# Patient Record
Sex: Female | Born: 1940 | Race: White | Hispanic: No | Marital: Married | Smoking: Never smoker
Health system: Southern US, Community
[De-identification: ages and names within clinical notes are randomized; demographics above are authoritative.]

## PROBLEM LIST (undated history)

## (undated) DIAGNOSIS — G35 Multiple sclerosis: Secondary | ICD-10-CM

## (undated) DIAGNOSIS — K219 Gastro-esophageal reflux disease without esophagitis: Secondary | ICD-10-CM

## (undated) DIAGNOSIS — F419 Anxiety disorder, unspecified: Secondary | ICD-10-CM

## (undated) DIAGNOSIS — M1990 Unspecified osteoarthritis, unspecified site: Secondary | ICD-10-CM

## (undated) DIAGNOSIS — J300 Vasomotor rhinitis: Secondary | ICD-10-CM

## (undated) HISTORY — DX: Anxiety disorder, unspecified: F41.9

## (undated) HISTORY — DX: Vasomotor rhinitis: J30.0

## (undated) HISTORY — DX: Unspecified osteoarthritis, unspecified site: M19.90

## (undated) HISTORY — DX: Multiple sclerosis: G35

## (undated) HISTORY — PX: ABDOMINAL HYSTERECTOMY: SHX81

## (undated) HISTORY — DX: Gastro-esophageal reflux disease without esophagitis: K21.9

---

## 1962-03-09 HISTORY — PX: BREAST LUMPECTOMY: SHX2

## 1997-07-19 ENCOUNTER — Ambulatory Visit (HOSPITAL_COMMUNITY): Admission: RE | Admit: 1997-07-19 | Discharge: 1997-07-19 | Payer: Self-pay | Admitting: Neurology

## 2003-04-17 ENCOUNTER — Emergency Department (HOSPITAL_COMMUNITY): Admission: EM | Admit: 2003-04-17 | Discharge: 2003-04-17 | Payer: Self-pay | Admitting: *Deleted

## 2003-10-05 ENCOUNTER — Encounter: Admission: RE | Admit: 2003-10-05 | Discharge: 2003-10-05 | Payer: Self-pay | Admitting: Neurology

## 2003-10-08 ENCOUNTER — Ambulatory Visit (HOSPITAL_COMMUNITY): Admission: RE | Admit: 2003-10-08 | Discharge: 2003-10-08 | Payer: Self-pay | Admitting: Neurology

## 2004-01-21 ENCOUNTER — Ambulatory Visit: Payer: Self-pay | Admitting: Family Medicine

## 2005-01-16 ENCOUNTER — Ambulatory Visit: Payer: Self-pay | Admitting: Internal Medicine

## 2005-12-25 ENCOUNTER — Ambulatory Visit: Payer: Self-pay | Admitting: Internal Medicine

## 2006-01-08 ENCOUNTER — Ambulatory Visit: Payer: Self-pay | Admitting: Internal Medicine

## 2006-04-06 ENCOUNTER — Ambulatory Visit: Payer: Self-pay | Admitting: Internal Medicine

## 2006-06-22 ENCOUNTER — Ambulatory Visit: Payer: Self-pay | Admitting: Internal Medicine

## 2006-09-02 ENCOUNTER — Ambulatory Visit: Payer: Self-pay | Admitting: Internal Medicine

## 2006-11-24 ENCOUNTER — Ambulatory Visit: Payer: Self-pay | Admitting: Internal Medicine

## 2007-01-07 ENCOUNTER — Encounter: Payer: Self-pay | Admitting: Internal Medicine

## 2007-01-14 ENCOUNTER — Ambulatory Visit: Payer: Self-pay | Admitting: Internal Medicine

## 2007-02-17 ENCOUNTER — Ambulatory Visit: Payer: Self-pay | Admitting: Internal Medicine

## 2007-05-03 ENCOUNTER — Ambulatory Visit: Payer: Self-pay | Admitting: Internal Medicine

## 2007-05-18 ENCOUNTER — Telehealth (INDEPENDENT_AMBULATORY_CARE_PROVIDER_SITE_OTHER): Payer: Self-pay | Admitting: *Deleted

## 2007-05-31 ENCOUNTER — Telehealth (INDEPENDENT_AMBULATORY_CARE_PROVIDER_SITE_OTHER): Payer: Self-pay | Admitting: *Deleted

## 2007-08-09 ENCOUNTER — Ambulatory Visit: Payer: Self-pay | Admitting: Internal Medicine

## 2007-08-10 ENCOUNTER — Encounter: Payer: Self-pay | Admitting: Internal Medicine

## 2007-08-18 DIAGNOSIS — M1990 Unspecified osteoarthritis, unspecified site: Secondary | ICD-10-CM | POA: Insufficient documentation

## 2007-08-18 DIAGNOSIS — J309 Allergic rhinitis, unspecified: Secondary | ICD-10-CM | POA: Insufficient documentation

## 2007-08-18 DIAGNOSIS — G8250 Quadriplegia, unspecified: Secondary | ICD-10-CM | POA: Insufficient documentation

## 2007-08-18 DIAGNOSIS — G35 Multiple sclerosis: Secondary | ICD-10-CM | POA: Insufficient documentation

## 2007-08-18 DIAGNOSIS — F411 Generalized anxiety disorder: Secondary | ICD-10-CM | POA: Insufficient documentation

## 2007-09-06 ENCOUNTER — Telehealth (INDEPENDENT_AMBULATORY_CARE_PROVIDER_SITE_OTHER): Payer: Self-pay | Admitting: *Deleted

## 2007-09-19 ENCOUNTER — Telehealth (INDEPENDENT_AMBULATORY_CARE_PROVIDER_SITE_OTHER): Payer: Self-pay | Admitting: *Deleted

## 2007-10-13 ENCOUNTER — Telehealth: Payer: Self-pay | Admitting: Internal Medicine

## 2007-10-18 ENCOUNTER — Telehealth: Payer: Self-pay | Admitting: Internal Medicine

## 2007-11-10 ENCOUNTER — Encounter: Payer: Self-pay | Admitting: Internal Medicine

## 2007-11-10 ENCOUNTER — Ambulatory Visit: Payer: Self-pay | Admitting: Internal Medicine

## 2008-02-07 ENCOUNTER — Encounter: Payer: Self-pay | Admitting: Internal Medicine

## 2008-02-07 ENCOUNTER — Ambulatory Visit: Payer: Self-pay | Admitting: Internal Medicine

## 2008-02-07 DIAGNOSIS — K219 Gastro-esophageal reflux disease without esophagitis: Secondary | ICD-10-CM

## 2008-03-12 ENCOUNTER — Telehealth: Payer: Self-pay | Admitting: Internal Medicine

## 2008-05-10 ENCOUNTER — Encounter: Payer: Self-pay | Admitting: Internal Medicine

## 2008-05-10 ENCOUNTER — Ambulatory Visit: Payer: Self-pay | Admitting: Internal Medicine

## 2008-05-10 DIAGNOSIS — J180 Bronchopneumonia, unspecified organism: Secondary | ICD-10-CM | POA: Insufficient documentation

## 2008-05-14 ENCOUNTER — Telehealth: Payer: Self-pay | Admitting: Internal Medicine

## 2008-08-14 ENCOUNTER — Encounter: Payer: Self-pay | Admitting: Internal Medicine

## 2008-08-14 ENCOUNTER — Ambulatory Visit: Payer: Self-pay | Admitting: Internal Medicine

## 2008-08-14 DIAGNOSIS — F068 Other specified mental disorders due to known physiological condition: Secondary | ICD-10-CM

## 2008-08-14 DIAGNOSIS — M79609 Pain in unspecified limb: Secondary | ICD-10-CM | POA: Insufficient documentation

## 2008-09-07 ENCOUNTER — Telehealth: Payer: Self-pay | Admitting: Family Medicine

## 2008-10-12 ENCOUNTER — Telehealth: Payer: Self-pay | Admitting: Internal Medicine

## 2008-11-08 ENCOUNTER — Telehealth: Payer: Self-pay | Admitting: Internal Medicine

## 2008-11-28 ENCOUNTER — Ambulatory Visit: Payer: Self-pay | Admitting: Internal Medicine

## 2008-11-28 ENCOUNTER — Encounter: Payer: Self-pay | Admitting: Internal Medicine

## 2009-02-11 ENCOUNTER — Telehealth: Payer: Self-pay | Admitting: Internal Medicine

## 2009-03-05 ENCOUNTER — Ambulatory Visit: Payer: Self-pay | Admitting: Internal Medicine

## 2009-03-05 ENCOUNTER — Encounter: Payer: Self-pay | Admitting: Internal Medicine

## 2009-03-11 ENCOUNTER — Telehealth: Payer: Self-pay | Admitting: Family Medicine

## 2009-06-25 ENCOUNTER — Encounter: Payer: Self-pay | Admitting: Internal Medicine

## 2009-06-25 ENCOUNTER — Ambulatory Visit: Payer: Self-pay | Admitting: Internal Medicine

## 2009-08-08 ENCOUNTER — Telehealth: Payer: Self-pay | Admitting: Family Medicine

## 2009-10-17 ENCOUNTER — Telehealth: Payer: Self-pay | Admitting: Internal Medicine

## 2009-10-23 ENCOUNTER — Encounter: Payer: Self-pay | Admitting: Internal Medicine

## 2009-10-23 ENCOUNTER — Ambulatory Visit: Payer: Self-pay | Admitting: Internal Medicine

## 2009-12-03 ENCOUNTER — Telehealth: Payer: Self-pay | Admitting: Internal Medicine

## 2010-01-21 ENCOUNTER — Encounter: Payer: Self-pay | Admitting: Internal Medicine

## 2010-01-21 ENCOUNTER — Ambulatory Visit: Payer: Self-pay | Admitting: Internal Medicine

## 2010-02-07 ENCOUNTER — Telehealth: Payer: Self-pay | Admitting: Internal Medicine

## 2010-03-06 ENCOUNTER — Telehealth: Payer: Self-pay | Admitting: Internal Medicine

## 2010-04-08 ENCOUNTER — Encounter: Payer: Self-pay | Admitting: Internal Medicine

## 2010-04-08 DIAGNOSIS — F411 Generalized anxiety disorder: Secondary | ICD-10-CM

## 2010-04-08 DIAGNOSIS — G8250 Quadriplegia, unspecified: Secondary | ICD-10-CM

## 2010-04-08 DIAGNOSIS — G35 Multiple sclerosis: Secondary | ICD-10-CM

## 2010-04-08 DIAGNOSIS — F068 Other specified mental disorders due to known physiological condition: Secondary | ICD-10-CM

## 2010-04-08 NOTE — Assessment & Plan Note (Signed)
Summary: Home visit   Vital Signs:  Patient profile:   70 year old female Pulse rate:   90 / minute Resp:     18 per minute BP sitting:   136 / 80  (right arm) CC: Follow up home visit   History of Present Illness: Husband here today  Generally doing okay No major changes  still has some problems with short term memory no major changes though  Has been feeling "antsy" at times xanax does help but she thinks she may need more she thinks this is somewhat sedating though---she falls asleep easy when sitting up Not depressed  always had trouble sleeping at night before she took the xanax and now that trouble is returning  No increase in pain Occ elbow pain stilffness in legs ASA at bedtime seems to help that  still gets up twice a day total care and complete hoyer llft for transfers No change still has full time help when husband is not here  Occ gets reflux has not been taking ranitidine lately---uses tums only occ  Allergies: 1)  ! Tramadol Hcl (Tramadol Hcl)  Past History:  Past medical, surgical, family and social histories (including risk factors) reviewed for relevance to current acute and chronic problems.  Past Medical History: Reviewed history from 11/28/2008 and no changes required. 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 Hysterectomy (1985) Lumpectomy- left breast (1964) Vaginal delivery x 2  Family History: Reviewed history from 11/10/2007 and no changes required. Dad died of bone marrow cancer @63  Mom died of leukemia @31  1 brother died of cirrhosis (alcohol) 1 brother  2 step sisters  Social History: Reviewed history from 06/25/2009 and no changes required. Marital Status: Married 2 children--daughter in [place] Son is drug addict Disabled Never Smoked Alcohol use-no  REviewed DNR --she  needs new form (08/14/08) Would not accept feeding tube  Review of Systems  The patient denies chest pain and syncope.         appetite is fine weight seems to be the same---no way to weigh Bowels fairly regular---hasn't needed the miralax lately (though not always every day) No sig SOB Occ get reflux but no ongoing symptoms Seems to have some post nasal drip---slight am throat soreness (that quickly went away)  Physical Exam  General:  alert.  NAD Neck:  supple, no masses, no thyromegaly, and no cervical lymphadenopathy.   Lungs:  normal respiratory effort, no intercostal retractions, no accessory muscle use, and normal breath sounds.   Heart:  normal rate, regular rhythm, no murmur, and no gallop.   Abdomen:  soft and non-tender.   Pulses:  faint in feet Extremities:  no edema Neurologic:  hypotonic extremeties no fasciculations or movement face symmetric voice audible but softer  Psych:  normally interactive, good eye contact, not anxious appearing, and not depressed appearing.     Impression & Recommendations:  Problem # 1:  MULTIPLE SCLEROSIS (ICD-340) Assessment Unchanged severe total care no sig changes  Problem # 2:  DEMENTIA (ICD-294.8) Assessment: Unchanged mild and mostly just recent memory no apparent sig progression  Problem # 3:  ANXIETY (ICD-300.00) more trouble wiith sleep at night will increase the nighttime dose discussed SSRI but doesn't seem necessary to make major changes  Her updated medication list for this problem includes:    Alprazolam 0.25 Mg Tabs (Alprazolam) .Marland Kitchen... 1 tablet by mouth in the morning and 2 at bedtime  Problem # 4:  GERD (ICD-530.81) Assessment: Comment Only has been using tums discussed going back to ranitidine if using tums freq Her updated medication list for this problem includes:    Ranitidine Hcl 150 Mg Tabs (Ranitidine hcl) .Marland Kitchen... 1 tab by mouth two times a day for heartburn  Problem # 5:  OSTEOARTHRITIS  (ICD-715.90) Assessment: Unchanged mild no regular meds  Complete Medication List: 1)  Alprazolam 0.25 Mg Tabs (Alprazolam) .Marland Kitchen.. 1 tablet by mouth in the morning and 2 at bedtime 2)  Baclofen 20 Mg Tabs (Baclofen) .... Take one by mouth 4 times a day 3)  Baclofen 10 Mg Tabs (Baclofen) .... Taek one by mouth 4 times a day 4)  Loratadine 10 Mg Tabs (Loratadine) .... Take one by mouth daily as needed for allergies 5)  Ranitidine Hcl 150 Mg Tabs (Ranitidine hcl) .Marland Kitchen.. 1 tab by mouth two times a day for heartburn  Other Orders: Flu Vaccine 71yrs + (57846) Admin 1st Vaccine (96295)  Patient Instructions: 1)  Will plan follow up visit in about 3 months   Orders Added: 1)  Flu Vaccine 78yrs + [90658] 2)  Admin 1st Vaccine [28413]   Immunizations Administered:  Influenza Vaccine # 1:    Vaccine Type: Fluvax 3+    Site: left deltoid    Mfr: GlaxoSmithKline    Dose: 0.5 ml    Route: IM    Given by: Cindee Salt MD    Exp. Date: 09/06/2010    Lot #: [id number]    VIS given: 10/01/09 version given January 21, 2010.    Physician counseled: yes  Flu Vaccine Consent Questions:    Do you have a history of severe allergic reactions to this vaccine? no    Any prior history of allergic reactions to egg and/or gelatin? no    Do you have a sensitivity to the preservative Thimersol? no    Do you have a past history of Guillan-Barre Syndrome? no    Do you currently have an acute febrile illness? no    Have you ever had a severe reaction to latex? no    Vaccine information given and explained to patient? yes    Are you currently pregnant? no   Immunizations Administered:  Influenza Vaccine # 1:    Vaccine Type: Fluvax 3+    Site: left deltoid    Mfr: GlaxoSmithKline    Dose: 0.5 ml    Route: IM    Given by: Cindee Salt MD    Exp. Date: 09/06/2010    Lot #: [id number]    VIS given: 10/01/09 version given January 21, 2010.    Physician counseled: yes

## 2010-04-08 NOTE — Assessment & Plan Note (Signed)
Summary: Home visit   Vital Signs:  Patient profile:   70 year old female Pulse rate:   72 / minute Resp:     14 per minute BP sitting:   142 / 84 CC: follow up home visit   History of Present Illness: [name] her caregiver is here  Still has weakness in her voice No sig change  Bowels are okay Uses miralax only occ Uses the cammode in general  [name] has noted some chafing around neck folds trying Gold bond which gives brief relief  Still gets up to chair two times a day with lift enjoys this time No skin redness or ulcers  [name] notes a decline in memory--mostly short term memory she notes some frustrations with forgetting things  Nerves are an occ problem More so noted by [name] than her Denies sig depression Doesn't feel more medication is needed  regularly need tums daily for some esophageal symptoms very vague though  Allergies: 1)  ! Tramadol Hcl (Tramadol Hcl)  Past History:  Past medical, surgical, family and social histories (including risk factors) reviewed for relevance to current acute and chronic problems.  Past Medical History: Reviewed history from 11/28/2008 and no changes required. 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 Hysterectomy (1985) Lumpectomy- left breast (1964) Vaginal delivery x 2  Family History: Reviewed history from 11/10/2007 and no changes required. Dad died of bone marrow cancer @63  Mom died of leukemia @31  1 brother died of cirrhosis (alcohol) 1 brother  2 step sisters  Social History: Reviewed history from 06/25/2009 and no changes required. Marital Status: Married 2 children--daughter in [place] Son is drug addict Disabled Never Smoked Alcohol use-no  REviewed DNR --she needs new form (08/14/08) Would not accept feeding tube  Review of Systems       Sleeps  well---occ falls asleep even trying to watch TV Appetite is okay weight seems to be stable Occ left hip pain  Physical Exam  General:  alert.  NAD Neck:  supple, no masses, no thyromegaly, no carotid bruits, and no cervical lymphadenopathy.   Lungs:  normal respiratory effort, no intercostal retractions, no accessory muscle use, and normal breath sounds.   Heart:  normal rate, regular rhythm, no murmur, and no gallop.   Abdomen:  soft and non-tender.   Msk:  no joint tenderness and no joint swelling.   Pulses:  1+ in feet Extremities:  no edema Neurologic:  decreased tone in extremities some clonus of ankles and left wrist Skin:  no suspicious lesions and no ulcerations.   Psych:  normally interactive, good eye contact, not anxious appearing, and not depressed appearing.     Impression & Recommendations:  Problem # 1:  MULTIPLE SCLEROSIS (ICD-340) Assessment Unchanged quadraplegic requires total care no sig change in condition  Problem # 2:  DEMENTIA (ICD-294.8) Assessment: Deteriorated slow progression per [name] No Rx indicated though  Problem # 3:  GERD (ICD-530.81) Assessment: Deteriorated may be causing her voice symptoms also will have them start regular zantac  Her updated medication list for this problem includes:    Ranitidine Hcl 150 Mg Tabs (Ranitidine hcl) .Marland Kitchen... 1 tab by mouth two times a day for heartburn  Problem # 4:  ANXIETY (ICD-300.00) Assessment: Comment Only has flares of increased symptoms per caregiver but she feels things are okay will add citalopram if worsens  Her updated medication list for this problem includes:  Alprazolam 0.25 Mg Tabs (Alprazolam) .Marland Kitchen... 1 tablet by mouth twice a day  Problem # 5:  OSTEOARTHRITIS (ICD-715.90) Assessment: Unchanged no major pain issues hasn't needed meds except occ aspirin  Complete Medication List: 1)  Alprazolam 0.25 Mg Tabs (Alprazolam) .Marland Kitchen.. 1 tablet by mouth twice a day 2)  Baclofen 20 Mg Tabs  (Baclofen) .... Take one by mouth 4 times a day 3)  Baclofen 10 Mg Tabs (Baclofen) .... Taek one by mouth 4 times a day 4)  Loratadine 10 Mg Tabs (Loratadine) .... Take one by mouth daily as needed for allergies 5)  Ranitidine Hcl 150 Mg Tabs (Ranitidine hcl) .Marland Kitchen.. 1 tab by mouth two times a day for heartburn  Patient Instructions: 1)  Will plan follow up in 2-3 months

## 2010-04-08 NOTE — Assessment & Plan Note (Signed)
Summary: Home visit   Vital Signs:  Patient profile:   70 year old female Pulse rate:   84 / minute Resp:     14 per minute BP sitting:   92 / 64 CC: Follow up home visit   History of Present Illness: Husband here  Some trouble with her bowels occ constipated will take miralax as needed  some episodes of more freq stools then also--- some variability  Sill tries to get up to use cammode  but incontinence  occ once had 3 stools in diaper in 1 day---this is unusual though  Gets up to chair two times a day still No skin problems or ulcers  voice is weak at times No sig trouble swallowing  Husband doesn't notice any worsening of mild memory problems No sig depressed Anxiety still well controlled with the meds  Allergies have not been a problems  Allergies: 1)  ! Tramadol Hcl (Tramadol Hcl)  Past History:  Past medical, surgical, family and social histories (including risk factors) reviewed for relevance to current acute and chronic problems.  Past Medical History: Reviewed history from 11/28/2008 and no changes required. 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 Hysterectomy (1985) Lumpectomy- left breast (1964) Vaginal delivery x 2  Family History: Reviewed history from 11/10/2007 and no changes required. Dad died of bone marrow cancer @63  Mom died of leukemia @31  1 brother died of cirrhosis (alcohol) 1 brother  2 step sisters  Social History: Marital Status: Married 2 children--daughter in [place] Son is drug addict Disabled Never Smoked Alcohol use-no  REviewed DNR --she needs new form (08/14/08) Would not accept feeding tube  Review of Systems       appetite is okay---sometimes has to force herself weight is fairly stable sleeps okay  Physical Exam  General:  alert.  NAD Neck:  supple, no  masses, and no cervical lymphadenopathy.   Lungs:  normal respiratory effort and normal breath sounds.   Heart:  normal rate, regular rhythm, no murmur, and no gallop.   Abdomen:  soft and non-tender.   Msk:  no joint tenderness and no joint swelling.   Extremities:  no sig edema Neurologic:  hypotonia in arms but still some clonus in hands Moderate increased tone  in legs without sig contractures clonus in feet also Skin:  no suspicious lesions and no ulcerations.   Psych:  normally interactive, good eye contact, not anxious appearing, and not depressed appearing.     Impression & Recommendations:  Problem # 1:  MULTIPLE SCLEROSIS (ICD-340) Assessment Unchanged severe but stable no Rx  Problem # 2:  QUADRIPLEGIA (ICD-344.00) Assessment: Comment Only spasm well controlled on the baclofen  Problem # 3:  ANXIETY (ICD-300.00) Assessment: Unchanged well controlled with current xanax  Her updated medication list for this problem includes:    Alprazolam 0.25 Mg Tabs (Alprazolam) .Marland Kitchen... 1 tablet by mouth twice a day  Problem # 4:  GERD (ICD-530.81) Assessment: Unchanged occ heartburn uses rolaids very occ  Problem # 5:  DEMENTIA (ICD-294.8) Assessment: Comment Only memory problems are very mild and fortunately don't appear progressive  Complete Medication List: 1)  Alprazolam 0.25 Mg Tabs (Alprazolam) .Marland Kitchen.. 1 tablet by mouth twice a day 2)  Baclofen 20 Mg Tabs (Baclofen) .... Take one by mouth 4 times a day 3)  Baclofen 10 Mg Tabs (Baclofen) .... Taek one by mouth 4 times a day 4)  Loratadine 10 Mg Tabs (Loratadine) .... Take one by mouth daily as needed for allergies  Patient Instructions: 1)  Will plan home visit again in about 3 months  Prevention & Chronic Care Immunizations   Influenza vaccine: Fluvax 3+  (11/28/2008)    Tetanus booster: Not documented    Pneumococcal vaccine: Pneumovax  (12/12/2003)    H. zoster vaccine: Not documented  Colorectal Screening    Hemoccult: Not documented    Colonoscopy: Not documented  Other Screening   Pap smear: Not documented    Mammogram: Normal  (09/28/2003)    DXA bone density scan: Not documented   Smoking status: never  (11/10/2007)  Lipids   Total Cholesterol: Not documented   LDL: Not documented   LDL Direct: Not documented   HDL: Not documented   Triglycerides: Not documented

## 2010-04-08 NOTE — Progress Notes (Signed)
Summary: refill request for alprazolam  Phone Note Refill Request Message from:  Fax from Pharmacy  Refills Requested: Medication #1:  ALPRAZOLAM 0.25 MG  TABS 1 tablet by mouth twice a day   Last Refilled: 07/11/2009 Faxed request from [facility], [phone].  Initial call taken by: Lowella Petties CMA,  August 08, 2009 1:26 PM  Follow-up for Phone Call        Home visit pt of Dr. Alphonsus Sias who is out of town. I think ok to fill in his absence. Follow-up by: Hannah Beat MD,  August 08, 2009 2:06 PM  Additional Follow-up for Phone Call Additional follow up Details #1::        Rx called to pharmacy Additional Follow-up by: Benny Lennert CMA Duncan Dull),  August 08, 2009 2:13 PM    Prescriptions: ALPRAZOLAM 0.25 MG  TABS (ALPRAZOLAM) 1 tablet by mouth twice a day  #60 x 5   Entered and Authorized by:   Hannah Beat MD   Signed by:   Hannah Beat MD on 08/08/2009   Method used:   Telephoned to ...       [facility]* (retail)       [address]       [place], [place]  [address]       Ph: [phone]       Fax: [phone]   RxID:   [id number]

## 2010-04-08 NOTE — Progress Notes (Signed)
Summary: alprazolam   Phone Note Refill Request Message from:  Fax from Pharmacy on February 07, 2010 9:30 AM  Refills Requested: Medication #1:  ALPRAZOLAM 0.25 MG  TABS 1 tablet by mouth in the morning and 2 at bedtime   Last Refilled: 01/09/2010 Refill request from [facility]. [phone]. Fax is on your desk.   Initial call taken by: Melody Comas,  February 07, 2010 9:30 AM  Follow-up for Phone Call        okay #90 x 5 note change in dose and relay to pharmacy Follow-up by: Cindee Salt MD,  February 07, 2010 1:08 PM  Additional Follow-up for Phone Call Additional follow up Details #1::        Rx called to pharmacy Additional Follow-up by: Linde Gillis CMA Duncan Dull),  February 07, 2010 2:08 PM    Prescriptions: ALPRAZOLAM 0.25 MG  TABS (ALPRAZOLAM) 1 tablet by mouth in the morning and 2 at bedtime  #90 x 5   Entered by:   Linde Gillis CMA (AAMA)   Authorized by:   Cindee Salt MD   Signed by:   Linde Gillis CMA (AAMA) on 02/07/2010   Method used:   Telephoned to ...       [facility]* (retail)       [address]       [place], [place]  [address]       Ph: [phone]       Fax: [phone]   RxID:   [id number]

## 2010-04-08 NOTE — Progress Notes (Signed)
Summary: refill  request for baclofen  Phone Note Refill Request Message from:  Fax from Pharmacy  Refills Requested: Medication #1:  BACLOFEN 10 MG  TABS taek one by mouth 4 times a day   Last Refilled: 11/04/2009 Faxed request from [facility] is on your desk.  Initial call taken by: Lowella Petties CMA,  December 03, 2009 12:03 PM  Follow-up for Phone Call        Rx completed in Dr. Tiajuana Amass Follow-up by: Cindee Salt MD,  December 03, 2009 2:03 PM    New/Updated Medications: BACLOFEN 20 MG  TABS (BACLOFEN) take one by mouth 4 times a day BACLOFEN 10 MG  TABS (BACLOFEN) taek one by mouth 4 times a day Prescriptions: BACLOFEN 10 MG  TABS (BACLOFEN) taek one by mouth 4 times a day  #120 x 11   Entered and Authorized by:   Cindee Salt MD   Signed by:   Cindee Salt MD on 12/03/2009   Method used:   Electronically to        [facility]* (retail)       [address]       [place], [place]  [address]       Ph: [phone]       Fax: [phone]   RxID:   [id number] BACLOFEN 20 MG  TABS (BACLOFEN) take one by mouth 4 times a day  #120 x 11   Entered and Authorized by:   Cindee Salt MD   Signed by:   Cindee Salt MD on 12/03/2009   Method used:   Electronically to        [facility]* (retail)       [address]       [place], [place]  [address]       Ph: [phone]       Fax: [phone]   RxID:   [id number]

## 2010-04-08 NOTE — Progress Notes (Signed)
Summary: home visit is ok  Phone Note Call from Patient Call back at Home Phone [phone]   Caller: [name]- care giver Summary of Call: Care giver called to say that a home visit on 8/17 will be fine, but she may have to leave around 3, and she just wanted to know if  that will be ok.  Pt's husband may be there. Initial call taken by: Lowella Petties CMA,  October 17, 2009 3:12 PM  Follow-up for Phone Call        okay Follow-up by: Cindee Salt MD,  October 17, 2009 5:02 PM

## 2010-04-08 NOTE — Progress Notes (Signed)
Summary: Baclofen  Phone Note Refill Request Message from:  Fax from Pharmacy on March 11, 2009 10:04 AM  Refills Requested: Medication #1:  BACLOFEN 20 MG  TABS take one by mouth 4 times a day [facility]  Phone:   [phone]   Method Requested: Electronic Initial call taken by: Delilah Shan CMA (AAMA),  March 11, 2009 10:04 AM    Prescriptions: BACLOFEN 10 MG  TABS (BACLOFEN) taek one by mouth 4 times a day  #120 x 12   Entered and Authorized by:   Ruthe Mannan MD   Signed by:   Ruthe Mannan MD on 03/11/2009   Method used:   Electronically to        [facility] (retail)       [address]       [place], [place]  [address]       Ph: [phone] or [phone]       Fax: [phone]   RxID:   [id number]

## 2010-04-10 NOTE — Progress Notes (Signed)
Summary: refill request for baclofen  Phone Note Refill Request Message from:  Fax from Pharmacy  Refills Requested: Medication #1:  BACLOFEN 20 MG  TABS take one by mouth 4 times a day   Last Refilled: 02/03/2010 Faxed request from [facility] is on your desk.  Initial call taken by: Lowella Petties CMA, AAMA,  March 06, 2010 8:55 AM  Follow-up for Phone Call        Rx completed in Dr. Tiajuana Amass Follow-up by: Cindee Salt MD,  March 06, 2010 12:51 PM    Prescriptions: BACLOFEN 20 MG  TABS (BACLOFEN) take one by mouth 4 times a day  #120 x 11   Entered and Authorized by:   Cindee Salt MD   Signed by:   Cindee Salt MD on 03/06/2010   Method used:   Electronically to        [facility]* (retail)       [address]       [place], [place]  [address]       Ph: [phone]       Fax: [phone]   RxID:   [id number]

## 2010-04-16 NOTE — Assessment & Plan Note (Signed)
Summary: Home visit   Vital Signs:  Patient profile:   70 year old female Pulse rate:   78 / minute Resp:     16 per minute BP sitting:   110 / 80  (left arm)  History of Present Illness: Shari Santana --caregiver here She has no new concerns  Has noted some trouble swallowing --just saliva; chokes her slightly at times ---"like I'm strangling, like you bring up something in a straw and it goes the wrong way" No problems with food or drink. Drinks thin liquids with straw without choking  Nerves have "calmed down some"  by her report Shari Santana feels that "sometimes it is like she is ready to jump out of her skin" still only using the xanax two times a day  No persistent feelings of depression  Ongoing spasm Satisfied with control by the baclofen  still having some stomach issues Occ heartburn but occ "it just doesn't work right"---she can't elaborate  bowels have been okay--not every day now though No miralax lately  Up two times a day as usual Full lift and remains totally dependent  occ elbow pain tylenol will help  Allergies: 1)  ! Tramadol Hcl (Tramadol Hcl)  Past History:  Past medical, surgical, family and social histories (including risk factors) reviewed for relevance to current acute and chronic problems.  Past Medical History: Reviewed history from 11/28/2008 and no changes required. 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 Hysterectomy (1985) Lumpectomy- left breast (1964) Vaginal delivery x 2  Family History: Reviewed history from 11/10/2007 and no changes required. Dad died of bone marrow cancer @63  Mom died of leukemia @31  1 brother died of cirrhosis (alcohol) 1 brother  2 step sisters  Social History: Reviewed history from 06/25/2009 and no changes required. Marital Status: Married 2 children--daughter in  [place] Son is drug addict Disabled Never Smoked Alcohol use-no  REviewed DNR --she needs new form (08/14/08) Would not accept feeding tube  Review of Systems       appetite is fine No apparent weight loss Goes out to hair stylist twice a month sleeps okay Occ headache---if sits on the cammode too long. Has to lean back with head support when out of bed Sleeping okay--the xanax helps this  Physical Exam  General:  alert.  In bed, NAD Neck:  supple, no masses, no thyromegaly, and no cervical lymphadenopathy.   Lungs:  normal respiratory effort, no intercostal retractions, no accessory muscle use, and normal breath sounds.   Heart:  normal rate, regular rhythm, and no gallop.   ??soft systolic murmur at base Abdomen:  soft and non-tender.   Msk:  Mild thickening along elbows No active synovitis Extremities:  no edema Neurologic:  flaccid extremities but some clonus in left hand Skin:  no suspicious lesions and no ulcerations.   Psych:  normally interactive, not anxious appearing, and not depressed appearing.     Impression & Recommendations:  Problem # 1:  MULTIPLE SCLEROSIS (ICD-340) Assessment Unchanged severe and totally dependent No sig change in status  Problem # 2:  ANXIETY (ICD-300.00) Assessment: Unchanged Shari Santana thinks her nerves are still active Patent notes satisfaction If worsens, would add low dose citalopram  Her updated medication list for this problem includes:    Alprazolam 0.25 Mg Tabs (Alprazolam) .Marland Kitchen... 1 tablet by mouth in the morning and 2 at bedtime  Problem # 3:  QUADRIPLEGIA (ICD-344.00) Assessment: Unchanged spasm  controlled with the baclofen tone is down but has upper motor neuron findings  Problem # 4:  DEMENTIA (ICD-294.8) Assessment: Unchanged mild and no changes in mild cognitive problems  Problem # 5:  OSTEOARTHRITIS (ICD-715.90) Assessment: Comment Only some in elbows tylenol helps  Complete Medication List: 1)  Alprazolam  0.25 Mg Tabs (Alprazolam) .Marland Kitchen.. 1 tablet by mouth in the morning and 2 at bedtime 2)  Baclofen 20 Mg Tabs (Baclofen) .... Take one by mouth 4 times a day 3)  Baclofen 10 Mg Tabs (Baclofen) .... Taek one by mouth 4 times a day 4)  Loratadine 10 Mg Tabs (Loratadine) .... Take one by mouth daily as needed for allergies 5)  Ranitidine Hcl 150 Mg Tabs (Ranitidine hcl) .Marland Kitchen.. 1 tab by mouth two times a day for heartburn  Patient Instructions: 1)  Will plan follow up visit in 2-3 months

## 2010-06-14 ENCOUNTER — Encounter: Payer: Self-pay | Admitting: Internal Medicine

## 2010-07-15 ENCOUNTER — Ambulatory Visit (INDEPENDENT_AMBULATORY_CARE_PROVIDER_SITE_OTHER): Payer: Medicare Other | Admitting: Internal Medicine

## 2010-07-15 ENCOUNTER — Encounter: Payer: Self-pay | Admitting: Internal Medicine

## 2010-07-15 VITALS — BP 132/40 | HR 84 | Resp 12

## 2010-07-15 DIAGNOSIS — G35 Multiple sclerosis: Secondary | ICD-10-CM

## 2010-07-15 DIAGNOSIS — K219 Gastro-esophageal reflux disease without esophagitis: Secondary | ICD-10-CM

## 2010-07-15 DIAGNOSIS — F411 Generalized anxiety disorder: Secondary | ICD-10-CM

## 2010-07-15 DIAGNOSIS — F068 Other specified mental disorders due to known physiological condition: Secondary | ICD-10-CM

## 2010-07-15 DIAGNOSIS — G8250 Quadriplegia, unspecified: Secondary | ICD-10-CM

## 2010-07-15 NOTE — Patient Instructions (Signed)
Will plan follow up in about 3 months 

## 2010-07-15 NOTE — Progress Notes (Signed)
Subjective:    Patient ID: Shari Santana, female    DOB: 11/18/1940, 70 y.o.   MRN: [id number]  HPI Reviewed status with caregiver [name] and her husband Fairly stable  Has been eating some strawberries---has loosened up bowels but no problem Eating okay Occ swallowing issues still----"like it doesn't go straight down...it eventually goes down" Occ trouble with pills also No choking or apparent aspiration Hasn't been taking the ranitidine regularly of late  No apparent change in mild memory problems Cognitive status stable  Anxiety persists but no worse Xanax does help Not depressed--though some family problems causing concern but nothing too serious  Occ arm pain Aspirin helps prn  Some leg and feet pain--still on the muscle relaxers  Current outpatient prescriptions:ALPRAZolam (XANAX) 0.25 MG tablet, Take 0.5 mg by mouth at bedtime as needed. And 0.25mg  in morning , Disp: , Rfl: ;  baclofen (LIORESAL) 10 MG tablet, Take 10 mg by mouth 4 (four) times daily.  , Disp: , Rfl: ;  baclofen (LIORESAL) 20 MG tablet, Take 20 mg by mouth 4 (four) times daily. Along with 10mg  dose , Disp: , Rfl:  loratadine (CLARITIN) 10 MG tablet, Take 10 mg by mouth daily as needed. For allergies , Disp: , Rfl: ;  ranitidine (ZANTAC) 150 MG tablet, Take 150 mg by mouth 2 (two) times daily.  , Disp: , Rfl:   Past Medical History  Diagnosis Date  . Multiple sclerosis     severe with quadroplegia  . Arthritis   . Anxiety   . Vasomotor rhinitis   . GERD (gastroesophageal reflux disease)   . Dementia     Past Surgical History  Procedure Date  . Abdominal hysterectomy   . Vaginal delivery     x 2  . Breast lumpectomy 1964    left    No family history on file.  History   Social History  . Marital Status: Married    Spouse Name: N/A    Number of Children: 2  . Years of Education: N/A   Occupational History  . Disabled    Social History Main Topics  . Smoking status: Never Smoker   .  Smokeless tobacco: Not on file  . Alcohol Use: No  . Drug Use: Not on file  . Sexually Active: Not on file   Other Topics Concern  . Not on file   Social History Narrative   Bedbound due to [facility] regular caregiver in addition to husbandSon is a drug addictHas DNRWould not accept feeding tube   Review of Systems Appetite is fine Weight seems stable Sleep has actually improved lately Still gets up with lift twice a day into chair     Objective:   Physical Exam  Constitutional: She is oriented to person, place, and time. She appears well-developed and well-nourished. No distress.       In bed as usual Can only move head  Neck: Normal range of motion. Neck supple. No thyromegaly present.  Cardiovascular: Normal rate, regular rhythm, normal heart sounds and intact distal pulses.  Exam reveals no gallop.   No murmur heard. Pulmonary/Chest: Effort normal and breath sounds normal. No respiratory distress. She has no wheezes. She has no rales.  Abdominal: Soft. There is no tenderness.  Musculoskeletal: She exhibits no edema and no tenderness.  Lymphadenopathy:    She has no cervical adenopathy.  Neurological: She is alert and oriented to person, place, and time.       Decreased tone No contractures  Skin: No rash noted.       No ulcers  Psychiatric: She has a normal mood and affect. Her behavior is normal. Judgment and thought content normal.          Assessment & Plan:

## 2010-07-17 ENCOUNTER — Other Ambulatory Visit: Payer: Self-pay | Admitting: *Deleted

## 2010-07-17 MED ORDER — RANITIDINE HCL 150 MG PO TABS: 150.0000 mg | ORAL_TABLET | Freq: Two times a day (BID) | ORAL | Status: DC

## 2010-09-22 ENCOUNTER — Other Ambulatory Visit: Payer: Self-pay | Admitting: *Deleted

## 2010-09-22 MED ORDER — ALPRAZOLAM 0.25 MG PO TABS: 0.5000 mg | ORAL_TABLET | Freq: Every evening | ORAL | Status: DC | PRN

## 2010-09-22 NOTE — Telephone Encounter (Signed)
rx faxed to pharmacy manually  

## 2010-09-22 NOTE — Telephone Encounter (Signed)
Okay #90 x 3 

## 2010-10-14 ENCOUNTER — Telehealth: Payer: Self-pay | Admitting: *Deleted

## 2010-10-14 NOTE — Telephone Encounter (Signed)
Patient calling asking when you would be making another home visit? Pt has a cough for 1 week and now it has a rattling sound, just want to make sure it's now pneumonia. Pt has been taking Alka-Seltzer cold plus, pt uses [employer] [employer].

## 2010-10-14 NOTE — Telephone Encounter (Signed)
Spoke with patient and advised results   

## 2010-10-14 NOTE — Telephone Encounter (Signed)
Probably not till next Wednesday Have her call if worsens and we can start antibiotic before i go out

## 2010-10-22 ENCOUNTER — Ambulatory Visit: Payer: Medicare Other | Admitting: Internal Medicine

## 2010-10-22 ENCOUNTER — Encounter: Payer: Self-pay | Admitting: Internal Medicine

## 2010-10-22 DIAGNOSIS — K219 Gastro-esophageal reflux disease without esophagitis: Secondary | ICD-10-CM

## 2010-10-22 DIAGNOSIS — J069 Acute upper respiratory infection, unspecified: Secondary | ICD-10-CM | POA: Insufficient documentation

## 2010-10-22 DIAGNOSIS — F411 Generalized anxiety disorder: Secondary | ICD-10-CM

## 2010-10-22 DIAGNOSIS — G8250 Quadriplegia, unspecified: Secondary | ICD-10-CM

## 2010-10-22 DIAGNOSIS — G35 Multiple sclerosis: Secondary | ICD-10-CM

## 2010-10-22 NOTE — Assessment & Plan Note (Signed)
Bed bound and total care Has husband and caregiver Up to wheelchair bid Uses meds for spasm

## 2010-10-22 NOTE — Assessment & Plan Note (Signed)
Okay with the ranitidine No ongoing symptoms

## 2010-10-22 NOTE — Patient Instructions (Signed)
Will plan follow up home visit in about 3 months 

## 2010-10-22 NOTE — Assessment & Plan Note (Signed)
Seems to be viral Has been improving over the past few days Did have aspiration event several weeks ago but this doesn't seem related

## 2010-10-22 NOTE — Progress Notes (Signed)
Subjective:    Patient ID: Shari Santana, female    DOB: 01-Dec-1940, 70 y.o.   MRN: [id number]  HPI Reviewed status with  [name], caregiver Did have cold symptoms noted since last week No sig fever Has had cough which is now loosening up Has taken some alka-seltzer Feels better now No SOB  Has spell strangling on piece of watermelon a couple of weeks ago Did get over it without complications Generally swallows okay but may have some mild residual irritation Heartburn seems to be controlled---doesn't remember anything but rare spells  Overall no sig changes Still bed/chair bound Gets up in chair twice a day Up for stools on cammode Incontinent of urine at times (or while in bed). (doesn't really get normal urge) Does get out to hairdresser twice a month---not really going out otherwise  No worsening of memory Just has mild issues that haven't progressed  occ anxiety spells but no panic Generally controlled with with xanax  Current Outpatient Prescriptions on File Prior to Visit  Medication Sig Dispense Refill  . ALPRAZolam (XANAX) 0.25 MG tablet Take 2 tablets (0.5 mg total) by mouth at bedtime as needed. And 0.25mg  in morning  90 tablet  3  . baclofen (LIORESAL) 10 MG tablet Take 10 mg by mouth 4 (four) times daily.        . baclofen (LIORESAL) 20 MG tablet Take 20 mg by mouth 4 (four) times daily. Along with 10mg  dose       . loratadine (CLARITIN) 10 MG tablet Take 10 mg by mouth daily as needed. For allergies       . ranitidine (ZANTAC) 150 MG tablet Take 1 tablet (150 mg total) by mouth 2 (two) times daily.  60 tablet  11    Allergies  Allergen Reactions  . Tramadol Hcl     REACTION: mental status changes    Past Medical History  Diagnosis Date  . Multiple sclerosis     severe with quadroplegia  . Arthritis   . Anxiety   . Vasomotor rhinitis   . GERD (gastroesophageal reflux disease)   . Dementia     Past Surgical History  Procedure Date  . Abdominal  hysterectomy   . Vaginal delivery     x 2  . Breast lumpectomy 1964    left    No family history on file.  History   Social History  . Marital Status: Married    Spouse Name: N/A    Number of Children: 2  . Years of Education: N/A   Occupational History  . Disabled    Social History Main Topics  . Smoking status: Never Smoker   . Smokeless tobacco: Not on file  . Alcohol Use: No  . Drug Use: Not on file  . Sexually Active: Not on file   Other Topics Concern  . Not on file   Social History Narrative   Bedbound due to [facility] regular caregiver in addition to husbandSon is a drug addictHas DNRWould not accept feeding tube   Review of Systems Appetite is fine Sleeps okay Not depressed     Objective:   Physical Exam  Constitutional: She appears well-developed and well-nourished.       Paralyzed in bed as usual  Neck: Normal range of motion. Neck supple.  Cardiovascular: Normal rate, regular rhythm and normal heart sounds.  Exam reveals no gallop.   No murmur heard. Pulmonary/Chest: Effort normal and breath sounds normal. No respiratory distress. She has no wheezes.  She has no rales.  Abdominal: Soft. There is no tenderness.  Musculoskeletal: She exhibits no edema and no tenderness.       No sig contractures  Lymphadenopathy:    She has no cervical adenopathy.  Neurological:       Hypotonic but slight clonus in left hand when moved  Skin: No rash noted.  Psychiatric: She has a normal mood and affect. Her behavior is normal.          Assessment & Plan:

## 2010-10-22 NOTE — Assessment & Plan Note (Signed)
Does okay in difficult circumstances Uses the xanax fairly regularly

## 2010-10-22 NOTE — Assessment & Plan Note (Signed)
endstage No Rx indicated

## 2010-12-02 ENCOUNTER — Other Ambulatory Visit: Payer: Self-pay | Admitting: *Deleted

## 2010-12-02 MED ORDER — BACLOFEN 10 MG PO TABS: 10.0000 mg | ORAL_TABLET | Freq: Four times a day (QID) | ORAL | Status: DC

## 2010-12-30 ENCOUNTER — Other Ambulatory Visit: Payer: Self-pay | Admitting: *Deleted

## 2010-12-30 MED ORDER — BACLOFEN 10 MG PO TABS: 10.0000 mg | ORAL_TABLET | Freq: Four times a day (QID) | ORAL | Status: DC

## 2010-12-30 MED ORDER — BACLOFEN 20 MG PO TABS: 20.0000 mg | ORAL_TABLET | Freq: Four times a day (QID) | ORAL | Status: DC

## 2010-12-30 NOTE — Telephone Encounter (Signed)
Rx sent for 1 year electronically

## 2010-12-30 NOTE — Telephone Encounter (Signed)
Received faxed refill request from pharmacy. Is it okay to refill? 

## 2011-02-04 ENCOUNTER — Non-Acute Institutional Stay: Payer: Medicare Other | Admitting: Internal Medicine

## 2011-02-04 ENCOUNTER — Encounter: Payer: Self-pay | Admitting: Internal Medicine

## 2011-02-04 DIAGNOSIS — G8250 Quadriplegia, unspecified: Secondary | ICD-10-CM

## 2011-02-04 DIAGNOSIS — G35D Multiple sclerosis, unspecified: Secondary | ICD-10-CM

## 2011-02-04 DIAGNOSIS — M79602 Pain in left arm: Secondary | ICD-10-CM

## 2011-02-04 DIAGNOSIS — F068 Other specified mental disorders due to known physiological condition: Secondary | ICD-10-CM

## 2011-02-04 DIAGNOSIS — M79609 Pain in unspecified limb: Secondary | ICD-10-CM

## 2011-02-04 DIAGNOSIS — F411 Generalized anxiety disorder: Secondary | ICD-10-CM

## 2011-02-04 DIAGNOSIS — G35 Multiple sclerosis: Secondary | ICD-10-CM

## 2011-02-04 DIAGNOSIS — K219 Gastro-esophageal reflux disease without esophagitis: Secondary | ICD-10-CM

## 2011-02-04 NOTE — Assessment & Plan Note (Signed)
Has 24 hour care from [facility] and husband Baclofen for muscle spasm/discomfort

## 2011-02-04 NOTE — Assessment & Plan Note (Signed)
Doing okay with bedtime zantac

## 2011-02-04 NOTE — Assessment & Plan Note (Signed)
Vague Seems dependent on movement and position Reassured that it doesn't appear to be something serious Tylenol or aspirin as needed

## 2011-02-04 NOTE — Progress Notes (Signed)
Subjective:    Patient ID: Shari Santana, female    DOB: [birth year], 70 y.o.   MRN: [id number]  HPI Reviewed status with [facility] --caregiver Fairly stable  Still with anxiety--more like restlessness Xanax helps some but feels "like I am going to jump out of my skin"more Wonders about using the xanax more Generally takes it 8AM and 8PM and occ at other times She feels the dose is adequate but may need increased frequency Tends to be more anxious when up in wheelchair Still up 4 hours bid  No sig change in memory or thinking  Having some left arm pain Notes esp if turning over onto right side Aspirin will help sometimes Pain is mostly in forearm but some in arm itself Usually okay if at rest It helps if someone adjusts the position of her arm (she cannot do this actively though)  Takes zantac at bedtime This seems to prevent nocturnal heartburn  Current Outpatient Prescriptions on File Prior to Visit  Medication Sig Dispense Refill  . baclofen (LIORESAL) 10 MG tablet Take 1 tablet (10 mg total) by mouth 4 (four) times daily.  120 each  11  . baclofen (LIORESAL) 20 MG tablet Take 1 tablet (20 mg total) by mouth 4 (four) times daily. Along with 10mg  dose  120 each  11  . loratadine (CLARITIN) 10 MG tablet Take 10 mg by mouth daily as needed. For allergies       . ranitidine (ZANTAC) 150 MG tablet Take 1 tablet (150 mg total) by mouth 2 (two) times daily.  60 tablet  11    Allergies  Allergen Reactions  . Tramadol Hcl     REACTION: mental status changes    Past Medical History  Diagnosis Date  . Multiple sclerosis     severe with quadroplegia  . Arthritis   . Anxiety   . Vasomotor rhinitis   . GERD (gastroesophageal reflux disease)   . Dementia     Past Surgical History  Procedure Date  . Abdominal hysterectomy   . Vaginal delivery     x 2  . Breast lumpectomy 1964    left    No family history on file.  History   Social History  . Marital Status: Married      Spouse Name: N/A    Number of Children: 2  . Years of Education: N/A   Occupational History  . Disabled    Social History Main Topics  . Smoking status: Never Smoker   . Smokeless tobacco: Not on file  . Alcohol Use: No  . Drug Use: Not on file  . Sexually Active: Not on file   Other Topics Concern  . Not on file   Social History Narrative   Bedbound due to [facility] regular caregiver in addition to husbandSon is a drug addictHas DNRWould not accept feeding tube   Review of Systems Sleeps fair but arm pain affects her at times Does nap in day sometimes Appetite is fine Weight seems to be stable    Objective:   Physical Exam  Constitutional:       In bed as usual Alert and normal interaction  Neck: Normal range of motion.  Cardiovascular: Normal rate, regular rhythm and normal heart sounds.  Exam reveals no gallop.   No murmur heard. Pulmonary/Chest: Effort normal and breath sounds normal. No respiratory distress. She has no wheezes. She has no rales.  Abdominal: Soft. There is no tenderness.  Musculoskeletal:  Some pain with passive ROM of left arm No swelling or specific joint findings  Lymphadenopathy:    She has no cervical adenopathy.  Neurological:       Hypotonic extremities No sig contractures---lacks full extension of knees and elbows though  Psychiatric: She has a normal mood and affect. Her behavior is normal. Thought content normal.          Assessment & Plan:

## 2011-02-04 NOTE — Assessment & Plan Note (Signed)
Has been having more of a problem with restlessness Will allow xanax up to every 4 hours---only one at a time

## 2011-02-04 NOTE — Assessment & Plan Note (Signed)
Stable end stage Bed and chair bound Full lift and total care

## 2011-02-04 NOTE — Patient Instructions (Signed)
Will plan follow up in about 3 months 

## 2011-02-04 NOTE — Assessment & Plan Note (Signed)
Mild cognitive problems which don't seem to be progressive

## 2011-03-19 ENCOUNTER — Other Ambulatory Visit: Payer: Self-pay | Admitting: *Deleted

## 2011-03-19 MED ORDER — ALPRAZOLAM 0.25 MG PO TABS: ORAL_TABLET | ORAL | Status: DC

## 2011-03-19 NOTE — Telephone Encounter (Signed)
rx called into pharmacy

## 2011-03-19 NOTE — Telephone Encounter (Signed)
Okay to change it to those instructions #90 x 5

## 2011-03-19 NOTE — Telephone Encounter (Signed)
Fax states "take 1 tablet by mouth every morning and 2 tablets at bedtime as directed", please advise on refill and instructions.

## 2011-04-29 ENCOUNTER — Ambulatory Visit: Payer: Medicare Other | Admitting: Internal Medicine

## 2011-04-29 ENCOUNTER — Encounter: Payer: Self-pay | Admitting: Internal Medicine

## 2011-04-29 DIAGNOSIS — M79609 Pain in unspecified limb: Secondary | ICD-10-CM

## 2011-04-29 DIAGNOSIS — F068 Other specified mental disorders due to known physiological condition: Secondary | ICD-10-CM

## 2011-04-29 DIAGNOSIS — R51 Headache: Secondary | ICD-10-CM

## 2011-04-29 DIAGNOSIS — F411 Generalized anxiety disorder: Secondary | ICD-10-CM

## 2011-04-29 DIAGNOSIS — G8250 Quadriplegia, unspecified: Secondary | ICD-10-CM

## 2011-04-29 DIAGNOSIS — K219 Gastro-esophageal reflux disease without esophagitis: Secondary | ICD-10-CM

## 2011-04-29 DIAGNOSIS — G35 Multiple sclerosis: Secondary | ICD-10-CM

## 2011-04-29 DIAGNOSIS — M79602 Pain in left arm: Secondary | ICD-10-CM

## 2011-04-29 NOTE — Assessment & Plan Note (Signed)
Doing okay with xanax Not as much of an issue today

## 2011-04-29 NOTE — Assessment & Plan Note (Signed)
Totally disabled 24 hour care with caregiver and husband Baclofen still seems to help in legs mostly

## 2011-04-29 NOTE — Assessment & Plan Note (Signed)
Doesn't seem to be related to teeth problems but still possible ??atypical facial pain or trigeminal neuralgia Pain is not often so no Rx for now

## 2011-04-29 NOTE — Assessment & Plan Note (Signed)
Controlled with ranitidine 

## 2011-04-29 NOTE — Assessment & Plan Note (Signed)
Severe ?facial pain related to this No Rx

## 2011-04-29 NOTE — Assessment & Plan Note (Signed)
Mild and non progressive 

## 2011-04-29 NOTE — Assessment & Plan Note (Signed)
Intermittent Better overall

## 2011-04-29 NOTE — Progress Notes (Signed)
Subjective:    Patient ID: Shari Santana, female    DOB: Aug 02, 1940, 71 y.o.   MRN: [id number]  HPI [facility] caregiver and husband are here No new concerns from them  She notes left jaw abnormality Sensitive to touch--really hurt bad briefly when husband reached over her when she was in the lift Did have a dental visit several weeks ago 2 fillings replaced after a cleaning showed loose fillings Pain predated this though Able to chew okay No ongoing problems though---mostly a numb feeling Does note occ brief pain when yawning  Still has some left arm pain Positional ---better when readjusted Has improved since last visit  Nerves still act up at times Not any worse Still uses xanax bid----rarely needed the extra  Stomach bothers her at times---when diaper secured against abdomen No heartburn problems Bowels are okay--prunes and occ miralax  Still up in chair bid usually Tries to use cammode when up  Current Outpatient Prescriptions on File Prior to Visit  Medication Sig Dispense Refill  . ALPRAZolam (XANAX) 0.25 MG tablet Take 1 tablet by mouth in the morning and 2 tablets by mouth at bedtime  90 tablet  5  . baclofen (LIORESAL) 10 MG tablet Take 1 tablet (10 mg total) by mouth 4 (four) times daily.  120 each  11  . baclofen (LIORESAL) 20 MG tablet Take 1 tablet (20 mg total) by mouth 4 (four) times daily. Along with 10mg  dose  120 each  11  . loratadine (CLARITIN) 10 MG tablet Take 10 mg by mouth daily as needed. For allergies       . ranitidine (ZANTAC) 150 MG tablet Take 1 tablet (150 mg total) by mouth 2 (two) times daily.  60 tablet  11    Allergies  Allergen Reactions  . Tramadol Hcl     REACTION: mental status changes    Past Medical History  Diagnosis Date  . Multiple sclerosis     severe with quadroplegia  . Arthritis   . Anxiety   . Vasomotor rhinitis   . GERD (gastroesophageal reflux disease)   . Dementia     Past Surgical History  Procedure Date  .  Abdominal hysterectomy   . Vaginal delivery     x 2  . Breast lumpectomy 1964    left    No family history on file.  History   Social History  . Marital Status: Married    Spouse Name: N/A    Number of Children: 2  . Years of Education: N/A   Occupational History  . Disabled    Social History Main Topics  . Smoking status: Never Smoker   . Smokeless tobacco: Not on file  . Alcohol Use: No  . Drug Use: Not on file  . Sexually Active: Not on file   Other Topics Concern  . Not on file   Social History Narrative   Bedbound due to [facility] regular caregiver in addition to husbandSon is a drug addictHas DNRWould not accept feeding tube   Review of Systems Appetite is okay--not great Weight seems to be stable     Objective:   Physical Exam  Constitutional: She appears well-nourished. No distress.  HENT:       Teeth look okay No gum problems Abnormal area is over left maxilla No TMJ findings  Neck: No thyromegaly present.  Cardiovascular: Normal rate, regular rhythm and normal heart sounds.  Exam reveals no gallop.   No murmur heard. Pulmonary/Chest: Effort normal and  breath sounds normal. No respiratory distress. She has no wheezes. She has no rales.  Abdominal: Soft. There is no tenderness.  Lymphadenopathy:    She has no cervical adenopathy.  Neurological:       quadraplegic Decreased tone in UE Some ?increase in LE  Skin: No rash noted.  Psychiatric: She has a normal mood and affect. Her behavior is normal.          Assessment & Plan:

## 2011-05-28 ENCOUNTER — Telehealth: Payer: Self-pay | Admitting: Internal Medicine

## 2011-05-28 NOTE — Telephone Encounter (Signed)
Caller: Shari Santana/Patient; PCP: Tillman Abide I.; CB#: [phone]; Call regarding Cuts, Scrapes, Abrasions; Pt got her foot stuck in her wheelchair, twisting her knee in the process on 05/27/11. No knee swelling, ankle is slightly discolored. Mild pain. Also pain to her L cheek onset 05/21/11. No swelling or redness. Homecare per Knee injury Protocol. Pt would like a call back from Dr. Alphonsus Sias. PLS CALL.

## 2011-05-28 NOTE — Telephone Encounter (Signed)
Discussed with her Seems like benign soft tissue injury to foot She is just using ASA prn  Cheek issue may be nerve hypersensitivity related to MS No meds for now Will observe over time

## 2011-06-12 ENCOUNTER — Telehealth: Payer: Self-pay | Admitting: Internal Medicine

## 2011-06-12 MED ORDER — PREDNISONE 20 MG PO TABS: ORAL_TABLET | ORAL | Status: DC

## 2011-06-12 NOTE — Telephone Encounter (Signed)
Okay to try prednisone 20mg   2 tabs daily for 5 days, then 1 tab daily for 5 days #15 x 1  To see if this helps

## 2011-06-12 NOTE — Telephone Encounter (Signed)
Caller: Linda/Patient; PCP: Tillman Abide I.; CB#: [phone]; ; ; Call regarding Pain in Left Cheek On Face and Numbness in Left Side.  Patient has MS; would like Rx for prednisone called in.  Was triaged for knee pain and facial pain 05/21/11 but was to monitor.  States has mild pain, and numbness on the L side of the cheek.  Knee pain is better with aspirin.  States it is not worse, but it is not better.  Per protocol, emergent symptoms denied; INFO TO OFFICE FOR PROVIDER REVIEW/RX/CALLBACK. USES [facility]. MAY REACH PATIENT AT [phone].

## 2011-06-12 NOTE — Telephone Encounter (Signed)
rx sent to pharmacy by e-script Spoke with patient and advised results   

## 2011-07-15 ENCOUNTER — Encounter: Payer: Self-pay | Admitting: Internal Medicine

## 2011-07-15 ENCOUNTER — Ambulatory Visit: Payer: Medicare Other | Admitting: Internal Medicine

## 2011-07-15 VITALS — BP 120/82 | HR 72 | Resp 14

## 2011-07-15 DIAGNOSIS — M79609 Pain in unspecified limb: Secondary | ICD-10-CM

## 2011-07-15 DIAGNOSIS — G35 Multiple sclerosis: Secondary | ICD-10-CM

## 2011-07-15 DIAGNOSIS — M79602 Pain in left arm: Secondary | ICD-10-CM

## 2011-07-15 DIAGNOSIS — G8250 Quadriplegia, unspecified: Secondary | ICD-10-CM

## 2011-07-15 DIAGNOSIS — K219 Gastro-esophageal reflux disease without esophagitis: Secondary | ICD-10-CM

## 2011-07-15 NOTE — Progress Notes (Signed)
Subjective:    Patient ID: Shari Santana, female    DOB: Jul 25, 1940, 71 y.o.   MRN: [id number]  HPI Husband is here  Has had ongoing cheek pain on left See phone notes She feels it got better with the prednisone--though still not back to normal  Still has arm pain intermittently Seems to be positional Tingly at times also  Anxiety is about the same Gets reasonable relief with the xanax bid  Baclofen still 4 times per day She feels this helps prevent spasm Did try weaning slightly---but she could tell a difference Still up to wheelchair twice a day Sits on cammode then to move bowels Incontinent of urine  Has intermittent heartburn Swallows okay Appetite is fine Uses the zantac regularly at bedtime  Current Outpatient Prescriptions on File Prior to Visit  Medication Sig Dispense Refill  . ALPRAZolam (XANAX) 0.25 MG tablet Take 1 tablet by mouth in the morning and 2 tablets by mouth at bedtime  90 tablet  5  . baclofen (LIORESAL) 10 MG tablet Take 1 tablet (10 mg total) by mouth 4 (four) times daily.  120 each  11  . baclofen (LIORESAL) 20 MG tablet Take 1 tablet (20 mg total) by mouth 4 (four) times daily. Along with 10mg  dose  120 each  11  . loratadine (CLARITIN) 10 MG tablet Take 10 mg by mouth daily as needed. For allergies       . predniSONE (DELTASONE) 20 MG tablet 2 tabs daily for 5 days, then 1 tab daily for 5 days  15 tablet  1  . ranitidine (ZANTAC) 150 MG tablet Take 1 tablet (150 mg total) by mouth 2 (two) times daily.  60 tablet  11    Allergies  Allergen Reactions  . Tramadol Hcl     REACTION: mental status changes    Past Medical History  Diagnosis Date  . Multiple sclerosis     severe with quadroplegia  . Arthritis   . Anxiety   . Vasomotor rhinitis   . GERD (gastroesophageal reflux disease)   . Dementia     Past Surgical History  Procedure Date  . Abdominal hysterectomy   . Vaginal delivery     x 2  . Breast lumpectomy 1964    left     No family history on file.  History   Social History  . Marital Status: Married    Spouse Name: N/A    Number of Children: 2  . Years of Education: N/A   Occupational History  . Disabled    Social History Main Topics  . Smoking status: Never Smoker   . Smokeless tobacco: Not on file  . Alcohol Use: No  . Drug Use: Not on file  . Sexually Active: Not on file   Other Topics Concern  . Not on file   Social History Narrative   Bedbound due to [facility] regular caregiver in addition to husbandSon is a drug addictHas DNRWould not accept feeding tube   Review of Systems Sleeps okay Memory seems okay---no change in mild symptoms    Objective:   Physical Exam  Constitutional: She appears well-developed and well-nourished. No distress.  Neck: Normal range of motion. Neck supple.  Cardiovascular: Normal rate, regular rhythm and normal heart sounds.  Exam reveals no gallop.   No murmur heard. Pulmonary/Chest: Effort normal and breath sounds normal. No respiratory distress. She has no wheezes. She has no rales.  Abdominal: Soft. There is no tenderness.  Musculoskeletal: She  exhibits no edema and no tenderness.  Lymphadenopathy:    She has no cervical adenopathy.  Neurological:       Hypotonic extremities Sustained clonus at both ankles  Skin: No rash noted.       No ulcers  Psychiatric: She has a normal mood and affect. Her behavior is normal.          Assessment & Plan:

## 2011-07-15 NOTE — Assessment & Plan Note (Signed)
Severe and disabling Left facial pain appears to be a CN7 manifestation of her MS Did remit with course of prednisone Don't want ongoing Rx but could give burst again if worsens

## 2011-07-15 NOTE — Assessment & Plan Note (Signed)
Bed and wheelchair bound Lift for transfers Total care Baclofen still helps prevent spasm

## 2011-07-15 NOTE — Assessment & Plan Note (Signed)
Ongoing and positional Doesn't appear to be directly MS related Discussed trying tylenol during day  Uses tylenol PM --1 helps her sleep

## 2011-07-15 NOTE — Assessment & Plan Note (Signed)
Occ symptoms but generally satisfied with the ranitidine

## 2011-09-21 ENCOUNTER — Other Ambulatory Visit: Payer: Self-pay | Admitting: *Deleted

## 2011-09-21 MED ORDER — ALPRAZOLAM 0.25 MG PO TABS: ORAL_TABLET | ORAL | Status: DC

## 2011-09-21 NOTE — Telephone Encounter (Signed)
rx called into pharmacy

## 2011-09-21 NOTE — Telephone Encounter (Signed)
Okay #90 x 5 

## 2011-10-27 ENCOUNTER — Ambulatory Visit: Payer: Medicare Other | Admitting: Internal Medicine

## 2011-10-27 ENCOUNTER — Encounter: Payer: Self-pay | Admitting: Internal Medicine

## 2011-10-27 VITALS — BP 124/70 | HR 96 | Resp 18

## 2011-10-27 DIAGNOSIS — F411 Generalized anxiety disorder: Secondary | ICD-10-CM

## 2011-10-27 DIAGNOSIS — G8250 Quadriplegia, unspecified: Secondary | ICD-10-CM

## 2011-10-27 DIAGNOSIS — J309 Allergic rhinitis, unspecified: Secondary | ICD-10-CM

## 2011-10-27 DIAGNOSIS — K219 Gastro-esophageal reflux disease without esophagitis: Secondary | ICD-10-CM

## 2011-10-27 DIAGNOSIS — G35 Multiple sclerosis: Secondary | ICD-10-CM

## 2011-10-27 MED ORDER — IPRATROPIUM BROMIDE 0.03 % NA SOLN: 2.0000 | Freq: Two times a day (BID) | NASAL | Status: DC

## 2011-10-27 NOTE — Assessment & Plan Note (Signed)
Has been okay No major problems with mood Uses the xanax regularly

## 2011-10-27 NOTE — Progress Notes (Signed)
Subjective:    Patient ID: Shari Santana, female    DOB: Feb 23, 1941, 71 y.o.   MRN: [id number]  HPI Husband is here  Voice is much worse Often can't get much volume at all Best if she is on her right side--esp bad if sitting up  Facial pain is better Left arm pain comes and goes  Still total care Life for transfers Up to wheelchair twice a day still Still feels some drawing, est in right leg, despite the baclofen  Having runny nose Notes it when sitting up on cammode but not other times Husband got chlorpheniramine and is giving it every 4 hours---may be helping some Discussed my concerns about possible sedation  Still has some issues with nerves Seems to be doing okay with the alprazolam bid (usually only uses 1 at night also)  Current Outpatient Prescriptions on File Prior to Visit  Medication Sig Dispense Refill  . ALPRAZolam (XANAX) 0.25 MG tablet Take 1 tablet by mouth in the morning and 2 tablets by mouth at bedtime  90 tablet  5  . baclofen (LIORESAL) 10 MG tablet Take 1 tablet (10 mg total) by mouth 4 (four) times daily.  120 each  11  . baclofen (LIORESAL) 20 MG tablet Take 1 tablet (20 mg total) by mouth 4 (four) times daily. Along with 10mg  dose  120 each  11  . loratadine (CLARITIN) 10 MG tablet Take 10 mg by mouth daily as needed. For allergies       . ranitidine (ZANTAC) 150 MG tablet Take 1 tablet (150 mg total) by mouth 2 (two) times daily.  60 tablet  11  . ipratropium (ATROVENT) 0.03 % nasal spray Place 2 sprays into the nose 2 (two) times daily.  30 mL  12    Allergies  Allergen Reactions  . Tramadol Hcl     REACTION: mental status changes    Past Medical History  Diagnosis Date  . Multiple sclerosis     severe with quadroplegia  . Arthritis   . Anxiety   . Vasomotor rhinitis   . GERD (gastroesophageal reflux disease)   . Dementia     Past Surgical History  Procedure Date  . Abdominal hysterectomy   . Vaginal delivery     x 2  . Breast  lumpectomy 1964    left    No family history on file.  History   Social History  . Marital Status: Married    Spouse Name: N/A    Number of Children: 2  . Years of Education: N/A   Occupational History  . Disabled    Social History Main Topics  . Smoking status: Never Smoker   . Smokeless tobacco: Not on file  . Alcohol Use: No  . Drug Use: Not on file  . Sexually Active: Not on file   Other Topics Concern  . Not on file   Social History Narrative   Bedbound due to [facility] regular caregiver in addition to husbandSon is a drug addictHas DNRWould not accept feeding tube   Review of Systems Appetite is fine. She tries to be careful to maintain weight Weight seems stable Sleeps okay Careful with teeth care----husband or aide floss and brush for her. Careful to not provoke facial pain Occ right foot pain when husband turns her No bedsores     Objective:   Physical Exam  Constitutional: She appears well-developed. No distress.       is usual  Neck: Normal range of  motion. Neck supple. No thyromegaly present.  Cardiovascular: Normal rate, regular rhythm and normal heart sounds.  Exam reveals no gallop.   No murmur heard. Pulmonary/Chest: Effort normal and breath sounds normal. No respiratory distress. She has no wheezes. She has no rales.  Abdominal: Soft. There is no tenderness.  Musculoskeletal: She exhibits no edema.  Lymphadenopathy:    She has no cervical adenopathy.  Neurological:       Decreased tone in extremities Significant clonus at ankles  Psychiatric: She has a normal mood and affect. Her behavior is normal.          Assessment & Plan:

## 2011-10-27 NOTE — Assessment & Plan Note (Signed)
Not really getting much effect from loratadine or chlortrimeton Will try iprotropium nasal spray

## 2011-10-27 NOTE — Assessment & Plan Note (Signed)
With some spasm at times in leg Fair control on the baclofen

## 2011-10-27 NOTE — Assessment & Plan Note (Signed)
controlled with ranitidine

## 2011-10-27 NOTE — Assessment & Plan Note (Signed)
E but no sig change Facial pain that may have been MS related is better--exacerbated by electric toothbrush though

## 2011-12-07 ENCOUNTER — Telehealth: Payer: Self-pay | Admitting: Internal Medicine

## 2011-12-07 NOTE — Telephone Encounter (Signed)
It will probably be late October or early November I don't think that is too late for the flu shot but no reason she can't come earlier if she wants to

## 2011-12-07 NOTE — Telephone Encounter (Signed)
Patient is calling to find out when you'll be coming to see her.  She's concerned about getting the flu shot.  She said if it won't be for awhile, she has transportation to come tomorrow if a nurse will come out to the [place] and give her the flu shot.

## 2011-12-08 NOTE — Telephone Encounter (Signed)
Patient came by today for a flu shot and was denied because she didn't have an appt, I advised patient that if she's out this week or next week to stop by and I will go out to the car and give her a flu shot, she doesn't need an appt, I will add her on when she comes.

## 2011-12-08 NOTE — Telephone Encounter (Signed)
i'm not sure, I don't know who turned her away, I will forward to [place]

## 2011-12-10 ENCOUNTER — Ambulatory Visit (INDEPENDENT_AMBULATORY_CARE_PROVIDER_SITE_OTHER): Payer: Medicare Other

## 2011-12-10 DIAGNOSIS — Z23 Encounter for immunization: Secondary | ICD-10-CM

## 2011-12-11 ENCOUNTER — Ambulatory Visit: Payer: Medicare Other

## 2011-12-30 ENCOUNTER — Other Ambulatory Visit: Payer: Self-pay | Admitting: *Deleted

## 2011-12-30 MED ORDER — BACLOFEN 10 MG PO TABS: 10.0000 mg | ORAL_TABLET | Freq: Four times a day (QID) | ORAL | Status: DC

## 2011-12-30 MED ORDER — BACLOFEN 20 MG PO TABS: 20.0000 mg | ORAL_TABLET | Freq: Four times a day (QID) | ORAL | Status: DC

## 2011-12-30 NOTE — Telephone Encounter (Signed)
Okay to refill for a year again

## 2011-12-30 NOTE — Telephone Encounter (Signed)
rx sent to pharmacy by e-script  

## 2012-01-05 ENCOUNTER — Encounter: Payer: Self-pay | Admitting: Internal Medicine

## 2012-01-05 ENCOUNTER — Ambulatory Visit: Payer: Medicare Other | Admitting: Internal Medicine

## 2012-01-05 VITALS — BP 104/66 | HR 74 | Resp 12

## 2012-01-05 DIAGNOSIS — G35 Multiple sclerosis: Secondary | ICD-10-CM

## 2012-01-05 DIAGNOSIS — F411 Generalized anxiety disorder: Secondary | ICD-10-CM

## 2012-01-05 DIAGNOSIS — K219 Gastro-esophageal reflux disease without esophagitis: Secondary | ICD-10-CM

## 2012-01-05 DIAGNOSIS — G8250 Quadriplegia, unspecified: Secondary | ICD-10-CM

## 2012-01-05 NOTE — Assessment & Plan Note (Signed)
Controlled with the ranitidine 

## 2012-01-05 NOTE — Assessment & Plan Note (Signed)
Severe disability but has been stable quadraplegic Would give another burst of prednisone if gets the cheek symptoms again

## 2012-01-05 NOTE — Assessment & Plan Note (Signed)
Satisfied with the bid alprazolam

## 2012-01-05 NOTE — Assessment & Plan Note (Signed)
Bed and wheelchair bound Up with lift Baclofen helps for spasms

## 2012-01-05 NOTE — Progress Notes (Signed)
Subjective:    Patient ID: Shari Santana, female    DOB: 02/04/1941, 71 y.o.   MRN: [id number]  HPI Husband is here She is not enthused about how she is doing but no specific problems  Has been using the ipratropium spray Tried it at first when she was supine--this didn't work Now getting when up in wheelchair and caregivers hold her head up Seems to be helping some  No throat sore Voice "comes and goes" but sounds louder to me today  Still gets up to wheelchair with lift twice a day Spasms seem controlled with meds--no recent change  No problems with indigestion  Takes  the ranitidine every night  Uses the alprazolam twice a day This keeps her nerves controlled Not really depressed  Current Outpatient Prescriptions on File Prior to Visit  Medication Sig Dispense Refill  . ALPRAZolam (XANAX) 0.25 MG tablet Take 1 tablet by mouth in the morning and 2 tablets by mouth at bedtime  90 tablet  5  . baclofen (LIORESAL) 10 MG tablet Take 1 tablet (10 mg total) by mouth 4 (four) times daily.  120 each  11  . baclofen (LIORESAL) 20 MG tablet Take 1 tablet (20 mg total) by mouth 4 (four) times daily. Along with 10mg  dose  120 each  11  . ipratropium (ATROVENT) 0.03 % nasal spray Place 2 sprays into the nose 2 (two) times daily.  30 mL  12  . loratadine (CLARITIN) 10 MG tablet Take 10 mg by mouth daily as needed. For allergies       . ranitidine (ZANTAC) 150 MG tablet Take 1 tablet (150 mg total) by mouth 2 (two) times daily.  60 tablet  11    Allergies  Allergen Reactions  . Tramadol Hcl     REACTION: mental status changes    Past Medical History  Diagnosis Date  . Multiple sclerosis     severe with quadroplegia  . Arthritis   . Anxiety   . Vasomotor rhinitis   . GERD (gastroesophageal reflux disease)   . Dementia     Past Surgical History  Procedure Date  . Abdominal hysterectomy   . Vaginal delivery     x 2  . Breast lumpectomy 1964    left    No family history  on file.  History   Social History  . Marital Status: Married    Spouse Name: N/A    Number of Children: 2  . Years of Education: N/A   Occupational History  . Disabled    Social History Main Topics  . Smoking status: Never Smoker   . Smokeless tobacco: Not on file  . Alcohol Use: No  . Drug Use: Not on file  . Sexually Active: Not on file   Other Topics Concern  . Not on file   Social History Narrative   Bedbound due to [facility] regular caregiver in addition to husbandSon is a drug addictHas DNRWould not accept intubation or any mechanical ventilationWould not accept feeding tube   Review of Systems Appetite is fine Weight seems stable Sleeps okay Gets down periods but no persistent depressed mood Some concerns about her relationship with her caregiver Bowels less regular. Miralax, even every other day, tended to be too strong. Not going as much when sitting up--makes clean up harder    Objective:   Physical Exam  Constitutional: She appears well-developed. No distress.  Neck: Normal range of motion.  Cardiovascular: Normal rate, regular rhythm and normal  heart sounds.  Exam reveals no gallop.   No murmur heard. Pulmonary/Chest: Effort normal and breath sounds normal. No respiratory distress. She has no wheezes. She has no rales.  Abdominal: Soft. There is no tenderness.  Musculoskeletal: She exhibits no edema.  Lymphadenopathy:    She has no cervical adenopathy.  Neurological:       Decreased tone throughout Sustained clonus at ankles  Psychiatric: She has a normal mood and affect. Thought content normal.          Assessment & Plan:

## 2012-04-04 ENCOUNTER — Telehealth: Payer: Self-pay | Admitting: Internal Medicine

## 2012-04-04 NOTE — Telephone Encounter (Signed)
Noted Will decide on course of action at the visit

## 2012-04-04 NOTE — Telephone Encounter (Signed)
Patient Information:  Caller Name: [facility]  Phone: [phone]  Patient: Shari, Santana  Gender: Female  DOB: [birth year]  Age: 72 Years  PCP: [facility]  Office Follow Up:  Does the office need to follow up with this patient?: Yes  Instructions For The Office: Caregiver wanted Dr Alphonsus Sias aware of vaginal discharge before home visit 04/05/12  RN Note:  Caregiver states pt has appt. for home visit tomorrow 04/05/12.  Symptoms  Reason For Call & Symptoms: Caregiver Mollie states pt has vaginal discharge thick and white in color and foul odor.  Caregiver also states Dr Alphonsus Sias is coming to the home tomorrow and she wanted him to be aware before he came.  Reviewed Health History In EMR: Yes  Reviewed Medications In EMR: Yes  Reviewed Allergies In EMR: Yes  Reviewed Surgeries / Procedures: Yes  Date of Onset of Symptoms: 04/03/2012  Guideline(s) Used:  Vaginal Discharge  Disposition Per Guideline:   See Within 3 Days in Office  Reason For Disposition Reached:   Bad smelling vaginal discharge  Advice Given:  N/A

## 2012-04-05 ENCOUNTER — Ambulatory Visit: Payer: Medicare Other | Admitting: Internal Medicine

## 2012-04-05 ENCOUNTER — Encounter: Payer: Self-pay | Admitting: Internal Medicine

## 2012-04-05 VITALS — BP 108/80 | HR 72 | Resp 16

## 2012-04-05 DIAGNOSIS — K219 Gastro-esophageal reflux disease without esophagitis: Secondary | ICD-10-CM

## 2012-04-05 DIAGNOSIS — F411 Generalized anxiety disorder: Secondary | ICD-10-CM

## 2012-04-05 DIAGNOSIS — J309 Allergic rhinitis, unspecified: Secondary | ICD-10-CM

## 2012-04-05 DIAGNOSIS — N898 Other specified noninflammatory disorders of vagina: Secondary | ICD-10-CM

## 2012-04-05 DIAGNOSIS — G35 Multiple sclerosis: Secondary | ICD-10-CM

## 2012-04-05 MED ORDER — FLUCONAZOLE 150 MG PO TABS: 150.0000 mg | ORAL_TABLET | Freq: Once | ORAL | Status: DC

## 2012-04-05 NOTE — Assessment & Plan Note (Signed)
With total disability from quadriplegia Continues to get care from husband and hired aides ?slight trouble turning head now--was able to turn it some though Continues on the baclofen

## 2012-04-05 NOTE — Assessment & Plan Note (Signed)
Still using the ipratropium Discussed humidifier and nasal saline prn

## 2012-04-05 NOTE — Assessment & Plan Note (Signed)
Seems to have fair control with the ranitidine

## 2012-04-05 NOTE — Assessment & Plan Note (Signed)
Controlled with the alprazolam Sleeps okay

## 2012-04-05 NOTE — Progress Notes (Signed)
Subjective:    Patient ID: Shari Santana, female    DOB: 1940-07-14, 72 y.o.   MRN: [id number]  HPI Shari Santana here  Feels the ipratropium spray helped the rhinorrhea at first Now having troubles getting the spray in correctly--is using it while sitting up Not sure if it is not actuating properly all the time Now using this just once a day in AM and using the loratadine also ?some increased congestion due to dry heat--Shari Santana starting using wood stove in basement Discussed trying humidifier  Shari Santana her caregiver noted some whitish discharge from her vagina yesterday Did have one brief spell of dysuria a couple of days ago No itching or discomfort No fever  Same status quadriplegic---up twice a day Total care obviously Hasn't had the arm pain as often---only rarely now Voice is still variable--reasonably strong today. Better when she is lying in bed than when sitting up Has noted a little problem turning her head to the left  Using the xanax regularly This helps her nerves Sleeps okay with the evening dose--takes 1 at bedtime then repeats it if needed No AM sedation No depressed mood of note  Heartburn is better now Fairly regular taking the ranitidine No swallowing difficulty  Current Outpatient Prescriptions on File Prior to Visit  Medication Sig Dispense Refill  . ALPRAZolam (XANAX) 0.25 MG tablet Take 1 tablet by mouth in the morning and 2 tablets by mouth at bedtime  90 tablet  5  . baclofen (LIORESAL) 10 MG tablet Take 1 tablet (10 mg total) by mouth 4 (four) times daily.  120 each  11  . baclofen (LIORESAL) 20 MG tablet Take 1 tablet (20 mg total) by mouth 4 (four) times daily. Along with 10mg  dose  120 each  11  . ipratropium (ATROVENT) 0.03 % nasal spray Place 2 sprays into the nose 2 (two) times daily.  30 mL  12  . loratadine (CLARITIN) 10 MG tablet Take 10 mg by mouth daily as needed. For allergies       . ranitidine (ZANTAC) 150 MG tablet Take 1 tablet (150 mg total)  by mouth 2 (two) times daily.  60 tablet  11    Allergies  Allergen Reactions  . Tramadol Hcl     REACTION: mental status changes    Past Medical History  Diagnosis Date  . Multiple sclerosis     severe with quadroplegia  . Arthritis   . Anxiety   . Vasomotor rhinitis   . GERD (gastroesophageal reflux disease)   . Dementia     Past Surgical History  Procedure Date  . Abdominal hysterectomy   . Vaginal delivery     x 2  . Breast lumpectomy 1964    left    No family history on file.  History   Social History  . Marital Status: Married    Spouse Name: N/A    Number of Children: 2  . Years of Education: N/A   Occupational History  . Disabled    Social History Main Topics  . Smoking status: Never Smoker   . Smokeless tobacco: Not on file  . Alcohol Use: No  . Drug Use: Not on file  . Sexually Active: Not on file   Other Topics Concern  . Not on file   Social History Narrative   Bedbound due to [facility] regular caregiver in addition to husbandSon is a drug addictHas DNRWould not accept intubation or any mechanical ventilationWould not accept feeding tube   Review  of Systems Gets occasional headache---aspirin or tylenol will help it Appetite is fine--never overly hungry but eats okay Bowels have been variable--had been regular but then slowed down. Eats prunes/uses miralax if slows down    Objective:   Physical Exam  Constitutional: She appears well-developed. No distress.  Neck: No thyromegaly present.  Cardiovascular: Normal rate, regular rhythm and normal heart sounds.  Exam reveals no gallop.   No murmur heard. Pulmonary/Chest: Effort normal and breath sounds normal. No respiratory distress. She has no wheezes. She has no rales.  Abdominal: Soft. There is no tenderness.  Musculoskeletal: She exhibits no edema.       No sig contractures  Lymphadenopathy:    She has no cervical adenopathy.  Neurological:       Hypotonic in extremities Sustained clonus  in feet   Psychiatric: She has a normal mood and affect. Her behavior is normal.          Assessment & Plan:

## 2012-04-05 NOTE — Assessment & Plan Note (Signed)
Sounds like yeast Will try empiric fluconazole and consider vaginal Rx if this doesn't clear

## 2012-04-15 ENCOUNTER — Other Ambulatory Visit: Payer: Self-pay | Admitting: *Deleted

## 2012-04-15 MED ORDER — ALPRAZOLAM 0.25 MG PO TABS: ORAL_TABLET | ORAL | Status: DC

## 2012-04-15 NOTE — Telephone Encounter (Signed)
Please call in.  Thanks.   

## 2012-04-15 NOTE — Telephone Encounter (Signed)
Rx phoned to pharmacy.  

## 2012-05-18 ENCOUNTER — Encounter: Payer: Self-pay | Admitting: Family Medicine

## 2012-05-18 ENCOUNTER — Ambulatory Visit (INDEPENDENT_AMBULATORY_CARE_PROVIDER_SITE_OTHER)
Admission: RE | Admit: 2012-05-18 | Discharge: 2012-05-18 | Disposition: A | Discharge status: Home / Self Care | Payer: Medicare Other | Source: Ambulatory Visit | Attending: Family Medicine | Admitting: Family Medicine

## 2012-05-18 ENCOUNTER — Ambulatory Visit: Payer: Self-pay | Admitting: Family Medicine

## 2012-05-18 ENCOUNTER — Ambulatory Visit (INDEPENDENT_AMBULATORY_CARE_PROVIDER_SITE_OTHER): Payer: Medicare Other | Admitting: Family Medicine

## 2012-05-18 ENCOUNTER — Telehealth: Payer: Self-pay | Admitting: Internal Medicine

## 2012-05-18 VITALS — HR 93 | Temp 98.0°F

## 2012-05-18 DIAGNOSIS — M79609 Pain in unspecified limb: Secondary | ICD-10-CM

## 2012-05-18 NOTE — Progress Notes (Signed)
Nature conservation officer at [facility] [address] [place] [address] Phone: [phone] Fax: [phone]  Date:  05/18/2012   Name:  Shari Santana   DOB:  [birth year]   MRN:  [id number] Gender: female Age: 72 y.o.  Primary Physician:  Tillman Abide, MD  Evaluating MD: Hannah Beat, MD   Chief Complaint: ? broken middle finger   History of Present Illness:  Shari Santana is a 72 y.o. pleasant patient who presents with the following:  Pleasant patient who has a painful finger that has been present for the last 10-14 days. It is unclear if she sustained any form of trauma, and she is quadriplegic and demented with end-stage MS. She does have sensation, and she notes to her family and caregivers that she has pain in her LEFT middle finger. There is considerable amount of bruising. They have been keeping it in a splint over this time.  Patient Active Problem List  Diagnosis  . DEMENTIA  . ANXIETY  . Multiple sclerosis  . QUADRIPLEGIA  . VASOMOTOR RHINITIS  . GERD  . OSTEOARTHRITIS  . Left arm pain  . Vaginal discharge    Past Medical History  Diagnosis Date  . Multiple sclerosis     severe with quadroplegia  . Arthritis   . Anxiety   . Vasomotor rhinitis   . GERD (gastroesophageal reflux disease)   . Dementia     Past Surgical History  Procedure Laterality Date  . Abdominal hysterectomy    . Vaginal delivery      x 2  . Breast lumpectomy  1964    left    History   Social History  . Marital Status: Married    Spouse Name: N/A    Number of Children: 2  . Years of Education: N/A   Occupational History  . Disabled    Social History Main Topics  . Smoking status: Never Smoker   . Smokeless tobacco: Not on file  . Alcohol Use: No  . Drug Use: Not on file  . Sexually Active: Not on file   Other Topics Concern  . Not on file   Social History Narrative   Bedbound due to MS   Has regular caregiver in addition to husband   Son is a drug  addict      Has DNR   Would not accept intubation or any mechanical ventilation   Would not accept feeding tube    No family history on file.  Allergies  Allergen Reactions  . Tramadol Hcl     REACTION: mental status changes    Medication list has been reviewed and updated.  Outpatient Prescriptions Prior to Visit  Medication Sig Dispense Refill  . ALPRAZolam (XANAX) 0.25 MG tablet Take 1 tablet by mouth in the morning and 2 tablets by mouth at bedtime  90 tablet  0  . baclofen (LIORESAL) 10 MG tablet Take 1 tablet (10 mg total) by mouth 4 (four) times daily.  120 each  11  . baclofen (LIORESAL) 20 MG tablet Take 1 tablet (20 mg total) by mouth 4 (four) times daily. Along with 10mg  dose  120 each  11  . fluconazole (DIFLUCAN) 150 MG tablet Take 1 tablet (150 mg total) by mouth once.  2 tablet  1  . ipratropium (ATROVENT) 0.03 % nasal spray Place 2 sprays into the nose 2 (two) times daily.  30 mL  12  . loratadine (CLARITIN) 10 MG tablet Take 10 mg  by mouth daily as needed. For allergies       . ranitidine (ZANTAC) 150 MG tablet Take 1 tablet (150 mg total) by mouth 2 (two) times daily.  60 tablet  11   No facility-administered medications prior to visit.    Review of Systems:  No fever, chills, sweats or chest pain. She is quadriplegic. Pain in her finger only.  Physical Examination: Pulse 93  Temp(Src) 98 F (36.7 C) (Oral)  SpO2 93%  Ideal Body Weight:    Gen.: Quadriplegia and with minimal head control. Currently in a wheelchair. Pulmonary: Breathing without difficulty. No respiratory distress. Extremities: No edema. No cyanosis. MSK: LEFT 3rd digit is significantly bruised and there is notable tenderness to palpation around the PIP joint. The medial and lateral ligaments are stable. Nontender at the MCP joints. Nontender throughout the rest of the hand from a bony anatomical standpoint, and nontender throughout the other fingers. Nontender in the wrist. Nontender in  the anatomical snuff box and nontender at the distal radius and ulna. Psych: Communicative, but difficult to understand. Flat affect.  Dg Finger Middle Left  05/18/2012  *RADIOLOGY REPORT*  Clinical Data: Pain in the left long finger.  LEFT MIDDLE FINGER 2+V  Comparison: None.  Findings: There is no fracture or dislocation.  The patient has severe osteopenia of the hand.  Minimal joint space narrowing of the IP joints of the fingers.  IMPRESSION: No acute abnormality.  Severe osteopenia.   Original Report Authenticated By: Francene Boyers, M.D.     Independent interpretation: there is a subtle radiolucency on the lateral aspect of the proximal phalanx at the PIP joint that is best seen on AP and oblique views. If this does represent an occult fracture, it is not new, and there appears to be good bony callus on the oblique, which would represent an older fracture. Overall, this is more difficult to assess given the appearance of the bone on film, and it does have severe  Osteopenia and a mottled appearance.  Assessment and Plan:  Finger pain, left - Plan: DG Finger Middle Left  Multiple sclerosis  DEMENTIA  QUADRIPLEGIA  Healing in stable very small proximal phalangeal fracture with excellent bony callus versus no occult fracture. Nevertheless, stable in any case. Discontinued splint, as this seems to be irritating the patient's skin. Buddy tape for the next 2 weeks.  Orders Today:  Orders Placed This Encounter  Procedures  . DG Finger Middle Left    Standing Status: Future     Number of Occurrences: 1     Standing Expiration Date: 07/18/2013    Order Specific Question:  Reason for exam:    Answer:  suspected fracture, 3rd digit    Order Specific Question:  Preferred imaging location?    Answer:  [facility]    Updated Medication List: (Includes new medications, updates to list, dose adjustments) No orders of the defined types were placed in this encounter.    Medications  Discontinued: There are no discontinued medications.    Signed, Elpidio Galea. Meziah Blasingame, MD 05/18/2012 12:12 PM

## 2012-05-18 NOTE — Telephone Encounter (Signed)
Patient Information:  Caller Name: Bonita Quin  Phone: [phone]  Patient: Shari Santana, Shari Santana  Gender: Female  DOB: [birth year]  Age: 72 Years  PCP: Tillman Abide [facility])  Office Follow Up:  Does the office need to follow up with this patient?: Yes  Instructions For The Office: Patient is seen by Dr. Alphonsus Sias who usually comes out to see her in home.  Patient is requesting to come in.  Schedule with first available appointment with Dr. Patsy Lager at 11:15.  Please be aware patient is unable to hold her head up for long period of times.  Please adjust appointment if needed.  RN Note:  Middle finger was black and purple 02/25-and entire finger was swollen.  It was wrapped up and taped when primary care giver saw it. No evidence of abuse, feels like it may have gotten bend or twisted during patient care- patient has decreased sensations due to diagnoses. Today,knuckle (middle) one is tender and swollen.  Patient complains of pain with being moved.   She is immobile with MS. States Tylenol not helping--rates pain 07/10 pain scale. Patient wants to be seen, but can't hold her head up for very long.  Dr. Alphonsus Sias has made house calls before.  RN will schedule appointment, but let office know in case they may want to redirect her care.  Symptoms  Reason For Call & Symptoms: Has a problem with finger- thinks it is broken.---doesn't remember injury. Middle finger on left.  Is a quadriplegic.  Reviewed Health History In EMR: Yes  Reviewed Medications In EMR: Yes  Reviewed Allergies In EMR: Yes  Reviewed Surgeries / Procedures: Yes  Date of Onset of Symptoms: 04/27/2012  Treatments Tried: Tylenol and taped to finger, ring finger.  Treatments Tried Worked: No  Guideline(s) Used:  Finger Pain  Disposition Per Guideline:   See Within 3 Days in Office  Reason For Disposition Reached:   Swollen joint and no fever or redness  Advice Given:  N/A  Appointment Scheduled:  05/18/2012  11:15:00 Appointment Scheduled Provider:  N/A

## 2012-05-27 ENCOUNTER — Other Ambulatory Visit: Payer: Self-pay | Admitting: *Deleted

## 2012-05-27 MED ORDER — ALPRAZOLAM 0.25 MG PO TABS: ORAL_TABLET | ORAL | Status: DC

## 2012-05-27 NOTE — Telephone Encounter (Signed)
rx called into pharmacy

## 2012-05-27 NOTE — Telephone Encounter (Signed)
Last filled 04/15/2012

## 2012-05-27 NOTE — Telephone Encounter (Signed)
Okay #90 x 0 

## 2012-05-27 NOTE — Telephone Encounter (Signed)
Routed to PCP 

## 2012-07-04 ENCOUNTER — Other Ambulatory Visit: Payer: Self-pay | Admitting: *Deleted

## 2012-07-04 MED ORDER — ALPRAZOLAM 0.25 MG PO TABS: ORAL_TABLET | ORAL | Status: DC

## 2012-07-04 NOTE — Telephone Encounter (Signed)
rx called into pharmacy

## 2012-07-04 NOTE — Telephone Encounter (Signed)
Ok to refill? Last filled 05/27/12.

## 2012-07-04 NOTE — Telephone Encounter (Signed)
Okay #90 x 1 

## 2012-07-05 ENCOUNTER — Encounter: Payer: Self-pay | Admitting: Internal Medicine

## 2012-07-05 ENCOUNTER — Ambulatory Visit: Payer: Medicare Other | Admitting: Internal Medicine

## 2012-07-05 VITALS — BP 110/82 | HR 84 | Resp 18

## 2012-07-05 DIAGNOSIS — H1012 Acute atopic conjunctivitis, left eye: Secondary | ICD-10-CM

## 2012-07-05 DIAGNOSIS — F411 Generalized anxiety disorder: Secondary | ICD-10-CM

## 2012-07-05 DIAGNOSIS — H1045 Other chronic allergic conjunctivitis: Secondary | ICD-10-CM

## 2012-07-05 DIAGNOSIS — J309 Allergic rhinitis, unspecified: Secondary | ICD-10-CM

## 2012-07-05 DIAGNOSIS — G35 Multiple sclerosis: Secondary | ICD-10-CM

## 2012-07-05 DIAGNOSIS — H1010 Acute atopic conjunctivitis, unspecified eye: Secondary | ICD-10-CM | POA: Insufficient documentation

## 2012-07-05 DIAGNOSIS — L989 Disorder of the skin and subcutaneous tissue, unspecified: Secondary | ICD-10-CM | POA: Insufficient documentation

## 2012-07-05 DIAGNOSIS — G8250 Quadriplegia, unspecified: Secondary | ICD-10-CM

## 2012-07-05 NOTE — Assessment & Plan Note (Signed)
Generally mild Does okay with the alprazolam

## 2012-07-05 NOTE — Assessment & Plan Note (Signed)
Better with the ipratropium spray though she doesn't sense it going in right

## 2012-07-05 NOTE — Assessment & Plan Note (Signed)
I recommended OTC naphcon-A or equivalent Discussed with husband

## 2012-07-05 NOTE — Assessment & Plan Note (Signed)
End stage No Rx for her progressive disease (though it has been stable now)

## 2012-07-05 NOTE — Assessment & Plan Note (Signed)
Suspicious for cancer I recommended biopsy by dermatologist

## 2012-07-05 NOTE — Progress Notes (Signed)
Subjective:    Patient ID: Shari Santana, female    DOB: 1940/03/16, 72 y.o.   MRN: [id number]  HPI Follow up appt Husband is here  Wonders about her arm paralysis She is now thinking it was related to prior rotator cuff injury This was perhaps 8 years ago when she was picked up with help when at hairdresser  Finger is better Bruising has resolved  Has some left eye drainage and redness No spring allergies Has had some mucous like gunk in eyes after sleeping Not itching but "I can tell it is not right"  Vaginal discharge cleared the next day after visit Didn't need the meds  Still gets up twice a day to chair Total care still  Ongoing nasal problems Still doesn't feel that the ipratropium gets into nose properly Does think it works better if instilled when she is sitting up Does feel that her rhinorrhea is better though  Anxiety has been okay Uses the alprazolam in AM and then to help sleep  Current Outpatient Prescriptions on File Prior to Visit  Medication Sig Dispense Refill  . ALPRAZolam (XANAX) 0.25 MG tablet Take 1 tablet by mouth in the morning and 2 tablets by mouth at bedtime  90 tablet  0  . baclofen (LIORESAL) 10 MG tablet Take 1 tablet (10 mg total) by mouth 4 (four) times daily.  120 each  11  . baclofen (LIORESAL) 20 MG tablet Take 1 tablet (20 mg total) by mouth 4 (four) times daily. Along with 10mg  dose  120 each  11  . fluconazole (DIFLUCAN) 150 MG tablet Take 1 tablet (150 mg total) by mouth once.  2 tablet  1  . ipratropium (ATROVENT) 0.03 % nasal spray Place 2 sprays into the nose 2 (two) times daily.  30 mL  12  . loratadine (CLARITIN) 10 MG tablet Take 10 mg by mouth daily as needed. For allergies       . ranitidine (ZANTAC) 150 MG tablet Take 1 tablet (150 mg total) by mouth 2 (two) times daily.  60 tablet  11   No current facility-administered medications on file prior to visit.    Allergies  Allergen Reactions  . Tramadol Hcl     REACTION:  mental status changes    Past Medical History  Diagnosis Date  . Multiple sclerosis     severe with quadroplegia  . Arthritis   . Anxiety   . Vasomotor rhinitis   . GERD (gastroesophageal reflux disease)   . Dementia     Past Surgical History  Procedure Laterality Date  . Abdominal hysterectomy    . Vaginal delivery      x 2  . Breast lumpectomy  1964    left    No family history on file.  History   Social History  . Marital Status: Married    Spouse Name: N/A    Number of Children: 2  . Years of Education: N/A   Occupational History  . Disabled    Social History Main Topics  . Smoking status: Never Smoker   . Smokeless tobacco: Not on file  . Alcohol Use: No  . Drug Use: Not on file  . Sexually Active: Not on file   Other Topics Concern  . Not on file   Social History Narrative   Bedbound due to MS   Has regular caregiver in addition to husband   Son is a drug addict      Has DNR  Would not accept intubation or any mechanical ventilation   Would not accept feeding tube   Review of Systems Appetite is fine--2 meals and a snack generally Weight seems to be about the same Still goes out to beauty shop every 2 weeks--pants seem to fit about the same Sleeps okay--using the alprazolam and this helps      Objective:   Physical Exam  Constitutional: She appears well-developed. No distress.  Eyes:  Slight bulbar and tarsal conjunctival injection on right Not really inflamed No discharge  Neck: No thyromegaly present.  Cardiovascular: Normal rate and regular rhythm.  Exam reveals no gallop.   No murmur heard. Pulmonary/Chest: Effort normal and breath sounds normal. No respiratory distress. She has no wheezes. She has no rales.  Abdominal: Soft. There is no tenderness.  Musculoskeletal: She exhibits no edema and no tenderness.  Left 2nd finger is not bruised or tender now  Lymphadenopathy:    She has no cervical adenopathy.  Neurological:   Hypotonic extremities--no active movement Clonus with flexion of feet still  Skin: No rash noted.  8-21mm polypoid slightly raised tan lesion on left cheek (recommended derm eval)  Psychiatric: She has a normal mood and affect. Her behavior is normal.          Assessment & Plan:

## 2012-07-05 NOTE — Assessment & Plan Note (Signed)
From MS Not sure about what she is thinking with the shoulder injury No other delusional thinking Continues to require total care

## 2012-08-03 ENCOUNTER — Other Ambulatory Visit: Payer: Self-pay

## 2012-08-03 MED ORDER — ALPRAZOLAM 0.25 MG PO TABS: ORAL_TABLET | ORAL | Status: DC

## 2012-08-03 NOTE — Telephone Encounter (Signed)
Okay #90 x 0 

## 2012-08-03 NOTE — Telephone Encounter (Signed)
rx called into pharmacy

## 2012-08-03 NOTE — Telephone Encounter (Signed)
Pt left note requesting refill alprazolam to [facility] pharmacy.Please advise.

## 2012-09-01 ENCOUNTER — Other Ambulatory Visit: Payer: Self-pay | Admitting: *Deleted

## 2012-09-01 MED ORDER — ALPRAZOLAM 0.25 MG PO TABS: ORAL_TABLET | ORAL | Status: DC

## 2012-09-01 NOTE — Telephone Encounter (Signed)
Okay #90 x 0 

## 2012-09-01 NOTE — Telephone Encounter (Signed)
rx called into pharmacy

## 2012-09-01 NOTE — Telephone Encounter (Signed)
Faxed refill request from [facility], last filled 08/03/12 for # 90.

## 2012-09-06 ENCOUNTER — Telehealth: Payer: Self-pay | Admitting: Internal Medicine

## 2012-09-06 MED ORDER — FLUCONAZOLE 150 MG PO TABS: 150.0000 mg | ORAL_TABLET | Freq: Once | ORAL | Status: DC

## 2012-09-06 NOTE — Telephone Encounter (Signed)
Discussed with Dutch Quint and less urine. Seems to just dribble out at times. She has no sensation there but has not been sick in any other way  Has some white vaginal discharge also  I will send Rx for fluconazole for apparent yeast infection She will increase her fluid intake and if no improvement in urine, consider empiric antibiotic at the end of the week

## 2012-09-06 NOTE — Telephone Encounter (Signed)
Pt's caregiver, [name] called in and states that pt still has a strong odor to her urine.  Caregiver says she thinks there's a possible UTI.  Could you please get in touch w/either [name] or pt's husband, [name]. [name] leaves at 2:00 p.m. Thank you.

## 2012-09-13 MED ORDER — SULFAMETHOXAZOLE-TMP DS 800-160 MG PO TABS: 1.0000 | ORAL_TABLET | Freq: Two times a day (BID) | ORAL | Status: DC

## 2012-09-13 NOTE — Telephone Encounter (Signed)
[name] said pt has been drinking more fluid but pt has sense of urgency to urinate and when urinates dribbles. Request antibiotic to [facility]. No fever and voiding less often than usual. [name] to give pt 2nd Diflucan today; vaginal discharge is better but not goen.Please advise. [name] request cb.

## 2012-09-13 NOTE — Telephone Encounter (Signed)
Okay to send Rx for septra DS (generic) 1 bid  #20 x 0

## 2012-09-13 NOTE — Telephone Encounter (Signed)
rx sent to pharmacy by e-script Spoke with patient's husband and advised results.  

## 2012-09-13 NOTE — Addendum Note (Signed)
Addended by: Sueanne Margarita on: 09/13/2012 03:15 PM   Modules accepted: Orders

## 2012-09-19 ENCOUNTER — Telehealth: Payer: Self-pay

## 2012-09-19 NOTE — Telephone Encounter (Signed)
pts husband said this morning pts caregiver noticed rash on pt's face;now rash is on upper body, no itching and no difficulty breathing. Mr Collantes thinks may be allergic reaction to generic Bactrim which pt started on 09/13/12. Pt last took med this AM, advised not to take any more Bactrim until call back from Dr Karle Starch asst. Mr Ontko request cb today. [place].Please advise.

## 2012-09-19 NOTE — Telephone Encounter (Signed)
Discussed with husband Sounds like allergic reaction to the sulfa Urine symptoms have resolved  Told him to stop med Can use loratadine prn for itching Call if worsens

## 2012-09-26 ENCOUNTER — Telehealth: Payer: Self-pay

## 2012-09-26 NOTE — Telephone Encounter (Signed)
Pt was not sure about how to take alprazolam; pt said she has been taking one tab bid and it is not helping her to relax at bedtime. Pt said she looked at the bottle and instructions were take 1 in AM and 2 at hs. Pt will try that and if no improvement she will call office back.

## 2012-09-26 NOTE — Telephone Encounter (Signed)
Okay I will be making a home visit later this month

## 2012-09-27 ENCOUNTER — Other Ambulatory Visit: Payer: Self-pay | Admitting: Internal Medicine

## 2012-09-27 NOTE — Telephone Encounter (Signed)
Okay #90 x 0 

## 2012-09-27 NOTE — Telephone Encounter (Signed)
rx called into pharmacy

## 2012-09-27 NOTE — Telephone Encounter (Signed)
Pt called again today to verify to take Alprazolam 0.25 mg one tab in AM and 2 at bedtime. Pt said she also takes baclofen 10 mg and 20 mg tab 4 x a day. I advised pt that was the instruction on her med list. Pt voiced understanding.

## 2012-10-05 ENCOUNTER — Ambulatory Visit: Payer: Medicare Other | Admitting: Internal Medicine

## 2012-10-05 ENCOUNTER — Encounter: Payer: Self-pay | Admitting: Internal Medicine

## 2012-10-05 VITALS — BP 106/60 | HR 78 | Resp 12

## 2012-10-05 DIAGNOSIS — G35 Multiple sclerosis: Secondary | ICD-10-CM

## 2012-10-05 DIAGNOSIS — G8250 Quadriplegia, unspecified: Secondary | ICD-10-CM

## 2012-10-05 DIAGNOSIS — J309 Allergic rhinitis, unspecified: Secondary | ICD-10-CM

## 2012-10-05 DIAGNOSIS — F411 Generalized anxiety disorder: Secondary | ICD-10-CM

## 2012-10-05 DIAGNOSIS — K219 Gastro-esophageal reflux disease without esophagitis: Secondary | ICD-10-CM

## 2012-10-05 NOTE — Assessment & Plan Note (Signed)
Doing okay with the alprazolam 2 at bedtime are helping her sleep

## 2012-10-05 NOTE — Progress Notes (Signed)
Subjective:    Patient ID: Shari Santana, female    DOB: [birth year], 72 y.o.   MRN: [id number]  HPI Husband is here  Reviewed ?UTI She has no feeling there Had bad smell-- and seemed dark Discussed that this is usually from concentration ?stinging with micturition?? Discussed need to increase fluids Can try cranberry tabs  Long time caregiver Shari Santana now has full time job Now just has Shari Santana (has been a back up) Interviewing to perhaps get replacement for [facility]  Had rash with the bactrim Finally seems better  Still has some anxiety Takes 1 alprazolam in AM Sleeping well now that she is taking the 2 at bedtime (1 just didn't do it for her) No sig depression---gets frustrated   Still total care Now using diapers in case she can't get up quick enough to sit on commode (for bowels) Gets up twice a day with lift  Appetite is fine No regular reflux problems---uses the ranitidine prn only  Current Outpatient Prescriptions on File Prior to Visit  Medication Sig Dispense Refill  . ALPRAZolam (XANAX) 0.25 MG tablet TAKE 1 TABLET BY MOUTH EVERY MORNING & 2 TABLETS EVERY NIGHT AT BEDTIME  90 tablet  0  . baclofen (LIORESAL) 10 MG tablet Take 1 tablet (10 mg total) by mouth 4 (four) times daily.  120 each  11  . baclofen (LIORESAL) 20 MG tablet Take 1 tablet (20 mg total) by mouth 4 (four) times daily. Along with 10mg  dose  120 each  11  . ipratropium (ATROVENT) 0.03 % nasal spray Place 2 sprays into the nose 2 (two) times daily.  30 mL  12  . loratadine (CLARITIN) 10 MG tablet Take 10 mg by mouth daily as needed. For allergies       . ranitidine (ZANTAC) 150 MG tablet Take 1 tablet (150 mg total) by mouth 2 (two) times daily.  60 tablet  11   No current facility-administered medications on file prior to visit.    Allergies  Allergen Reactions  . Tramadol Hcl     REACTION: mental status changes  . Bactrim (Sulfamethoxazole W-Trimethoprim)     Rash    Past Medical History   Diagnosis Date  . Multiple sclerosis     severe with quadroplegia  . Arthritis   . Anxiety   . Vasomotor rhinitis   . GERD (gastroesophageal reflux disease)   . Dementia     Past Surgical History  Procedure Laterality Date  . Abdominal hysterectomy    . Vaginal delivery      x 2  . Breast lumpectomy  1964    left    No family history on file.  History   Social History  . Marital Status: Married    Spouse Name: N/A    Number of Children: 2  . Years of Education: N/A   Occupational History  . Disabled    Social History Main Topics  . Smoking status: Never Smoker   . Smokeless tobacco: Not on file  . Alcohol Use: No  . Drug Use: Not on file  . Sexually Active: Not on file   Other Topics Concern  . Not on file   Social History Narrative   Bedbound due to MS   Has regular caregiver in addition to husband   Son is a drug addict      Has DNR   Would not accept intubation or any mechanical ventilation   Would not accept feeding tube   Review of  Systems Did see dermatologist for left cheek lesion---benign Notices something on the inside of her mouth when chewing--pain at times--left side New pad for wheelchair seat--seems to be helping her comfort    Objective:   Physical Exam  Constitutional: She appears well-developed. No distress.  In bed as usual  HENT:  Mouth/Throat: Oropharynx is clear and moist. No oropharyngeal exudate.  No oral lesions seen  Neck: Neck supple. No thyromegaly present.  Cardiovascular: Normal rate, regular rhythm and normal heart sounds.  Exam reveals no gallop and no friction rub.   No murmur heard. Pulmonary/Chest: Effort normal and breath sounds normal. No respiratory distress. She has no wheezes. She has no rales.  Abdominal: Soft. There is no tenderness.  Lymphadenopathy:    She has no cervical adenopathy.  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   Psychiatric: She has a normal mood and affect. Her behavior is normal.          Assessment & Plan:

## 2012-10-05 NOTE — Assessment & Plan Note (Signed)
Severe but stable Bed/chair bound Total care Now not able to get up for bathroom

## 2012-10-05 NOTE — Assessment & Plan Note (Signed)
Rhinorrhea is better Still using the nasal spray

## 2012-10-05 NOTE — Assessment & Plan Note (Signed)
Only using the ranitidine prn

## 2012-10-05 NOTE — Assessment & Plan Note (Signed)
Total care Discussed urinary issues---doesn't seem to have chronic retention Increase fluids

## 2012-10-28 ENCOUNTER — Other Ambulatory Visit: Payer: Self-pay | Admitting: Internal Medicine

## 2012-10-28 NOTE — Telephone Encounter (Signed)
Okay #90 x 0 

## 2012-10-28 NOTE — Telephone Encounter (Signed)
rx called into pharmacy

## 2012-11-28 ENCOUNTER — Other Ambulatory Visit: Payer: Self-pay | Admitting: Internal Medicine

## 2012-11-28 NOTE — Telephone Encounter (Signed)
rx called into pharmacy

## 2012-11-28 NOTE — Telephone Encounter (Signed)
Okay #90 x 0 

## 2012-11-29 ENCOUNTER — Telehealth: Payer: Self-pay | Admitting: Internal Medicine

## 2012-11-29 NOTE — Telephone Encounter (Signed)
Pt called and needs to schedule a home visit. She's having some problems poss related to her MS. Pt is having excruciating pain when trying to chew. Could you please call her to schedule a home visit? Thank you.

## 2012-11-29 NOTE — Telephone Encounter (Signed)
Patient added on to 11/30/12 schedule.

## 2012-11-29 NOTE — Telephone Encounter (Signed)
I will see her tomorrow afternoon Discussed this with her   Shari Santana, Please add her to my schedule tomorrow

## 2012-11-30 ENCOUNTER — Ambulatory Visit: Payer: Medicare Other | Admitting: Internal Medicine

## 2012-11-30 ENCOUNTER — Encounter: Payer: Self-pay | Admitting: Internal Medicine

## 2012-11-30 VITALS — BP 102/60 | HR 80 | Resp 12

## 2012-11-30 DIAGNOSIS — J309 Allergic rhinitis, unspecified: Secondary | ICD-10-CM

## 2012-11-30 DIAGNOSIS — F411 Generalized anxiety disorder: Secondary | ICD-10-CM

## 2012-11-30 DIAGNOSIS — G8250 Quadriplegia, unspecified: Secondary | ICD-10-CM

## 2012-11-30 DIAGNOSIS — G501 Atypical facial pain: Secondary | ICD-10-CM

## 2012-11-30 DIAGNOSIS — G35 Multiple sclerosis: Secondary | ICD-10-CM

## 2012-11-30 DIAGNOSIS — Z23 Encounter for immunization: Secondary | ICD-10-CM

## 2012-11-30 MED ORDER — IPRATROPIUM BROMIDE 0.03 % NA SOLN: 2.0000 | Freq: Four times a day (QID) | NASAL | Status: DC

## 2012-11-30 NOTE — Assessment & Plan Note (Signed)
Will try the increased dose of ipratropium and loratadine

## 2012-11-30 NOTE — Assessment & Plan Note (Signed)
Total care Lifts for transfers Still on baclofen for spasms

## 2012-11-30 NOTE — Assessment & Plan Note (Signed)
Does okay with the alprazolam 

## 2012-11-30 NOTE — Addendum Note (Signed)
Addended by: Sueanne Margarita on: 11/30/2012 03:52 PM   Modules accepted: Orders

## 2012-11-30 NOTE — Progress Notes (Signed)
Subjective:    Patient ID: Shari Santana, female    DOB: 1940/08/07, 72 y.o.   MRN: [id number]  HPI Husband is here  Called for early appointment Having pain in cheek and it feels swollen Also when eating, she has pain on left side of mouth--tries to chew on the right side This first occurred some months ago (when husband had transferred her) Has noticed some pain when the left side of face is washed also Pain is sharp-- comes and goes  Ongoing rhinorrhea and drainage down throat Irritates her throat Initially thought the ipratropium helped--but not really doing any good now Had stopped the loratadine when starting the spray Not known to have hay fever  Mild anxiety persists Alprazolam helps with nerves and she generally sleeps well with it (or if trouble initiating, she is still calm)  Has new caregiver Mardella Layman-- 5 hours per day Getting used to her routines Does passive ROM, etc Gets up bid into wheelchair---with lift  Current Outpatient Prescriptions on File Prior to Visit  Medication Sig Dispense Refill  . ALPRAZolam (XANAX) 0.25 MG tablet TAKE 1 TABLET BY MOUTH EVERY MORNING & 2 TABLETS EVERY NIGHT AT BEDTIME  90 tablet  0  . baclofen (LIORESAL) 10 MG tablet Take 1 tablet (10 mg total) by mouth 4 (four) times daily.  120 each  11  . baclofen (LIORESAL) 20 MG tablet Take 1 tablet (20 mg total) by mouth 4 (four) times daily. Along with 10mg  dose  120 each  11  . loratadine (CLARITIN) 10 MG tablet Take 10 mg by mouth daily as needed. For allergies       . ranitidine (ZANTAC) 150 MG tablet Take 150 mg by mouth 2 (two) times daily as needed.       No current facility-administered medications on file prior to visit.    Allergies  Allergen Reactions  . Tramadol Hcl     REACTION: mental status changes  . Bactrim [Sulfamethoxazole W-Trimethoprim]     Rash    Past Medical History  Diagnosis Date  . Multiple sclerosis     severe with quadroplegia  . Arthritis   .  Anxiety   . Vasomotor rhinitis   . GERD (gastroesophageal reflux disease)   . Dementia     Past Surgical History  Procedure Laterality Date  . Abdominal hysterectomy    . Vaginal delivery      x 2  . Breast lumpectomy  1964    left    No family history on file.  History   Social History  . Marital Status: Married    Spouse Name: N/A    Number of Children: 2  . Years of Education: N/A   Occupational History  . Disabled    Social History Main Topics  . Smoking status: Never Smoker   . Smokeless tobacco: Not on file  . Alcohol Use: No  . Drug Use: Not on file  . Sexual Activity: Not on file   Other Topics Concern  . Not on file   Social History Narrative   Bedbound due to MS   Has regular caregiver in addition to husband   Son is a drug addict      Has DNR   Would not accept intubation or any mechanical ventilation   Would not accept feeding tube   Review of Systems Sleeps okay Appetite is good Inconsistent with teeth brushing due to pain in the left face Bowels okay Wears diaper in case she  can't get on bedpan soon enough--no longer using cammode (unstable). No longer gets enough warning before she has to void--and voice too weak to yell out fast enough at times    Objective:   Physical Exam  Constitutional: She appears well-developed. No distress.  HENT:  Mouth/Throat: Oropharynx is clear and moist. No oropharyngeal exudate.  No oral lesions  Neck: No thyromegaly present.  Cardiovascular: Normal rate, regular rhythm, normal heart sounds and intact distal pulses.  Exam reveals no gallop.   No murmur heard. Musculoskeletal: She exhibits no edema and no tenderness.  Lymphadenopathy:    She has no cervical adenopathy.  Neurological:  Hypotonic No abnormal sensation in cheek  Psychiatric: She has a normal mood and affect. Her behavior is normal.          Assessment & Plan:

## 2012-11-30 NOTE — Assessment & Plan Note (Signed)
Seems nerve related Not bad enough to warrant meds Will try gabapentin or carbamazepine if worsens

## 2012-11-30 NOTE — Assessment & Plan Note (Signed)
Severe but stable Rx not indicated

## 2012-12-02 ENCOUNTER — Telehealth: Payer: Self-pay

## 2012-12-02 NOTE — Telephone Encounter (Addendum)
pts care giver left v/m pt has a question and request cb to pts husband's cell phone. I spoke with Mr Riess and he does not know what call is about and he is out of town until later today. pts care giver leaves pt at 2 pm. Mr Souders will ck with sister and have her cb about need of pt.

## 2012-12-05 ENCOUNTER — Telehealth: Payer: Self-pay

## 2012-12-05 NOTE — Telephone Encounter (Signed)
Pt said Dr Alphonsus Sias wrote down to take Loratadine 10 mg once daily as needed for allergies. Pt said she thought Dr Alphonsus Sias said she could take Loratadine 10 mg twice a day if needed for allergies. Advised pt on med list Loratadine 10 mg is once daily as needed for allergies but pt wants to verify with Dr Alphonsus Sias.pt request cb.

## 2012-12-05 NOTE — Telephone Encounter (Signed)
I did say that and she can increase to twice a day if needed Please let her know

## 2012-12-05 NOTE — Telephone Encounter (Signed)
Mr Mui said that pt had a question about a medication but they have resolved the issue and do not need further assistance.

## 2012-12-06 NOTE — Telephone Encounter (Signed)
Spoke with patient and advised results   

## 2012-12-12 ENCOUNTER — Telehealth: Payer: Self-pay

## 2012-12-12 MED ORDER — GABAPENTIN 100 MG PO CAPS: 100.0000 mg | ORAL_CAPSULE | Freq: Every evening | ORAL | Status: DC | PRN

## 2012-12-12 NOTE — Telephone Encounter (Signed)
Please send Rx for gabapentin for 100mg   1-3 at bedtime as directed #90 x 2  Have her start with 1 at bedtime for 3 days. If no problems but pain persists after 3 days, go up to 200mg  and then 300mg  after 6 days. If at 300mg  nightly in 2 weeks and still having significant pain, have them call to let me know

## 2012-12-12 NOTE — Telephone Encounter (Signed)
rx sent to pharmacy by e-script Spoke with patient and advised results   

## 2012-12-12 NOTE — Telephone Encounter (Signed)
.  left message to have patient return my call.  

## 2012-12-12 NOTE — Telephone Encounter (Signed)
Pt seen 11/30/12 with lt cheek pain; pt did not want pain med at time of visit but pt said pain progressively worsened. Pain level now is 10. Pt request med sent to [facility].Please advise.

## 2012-12-15 ENCOUNTER — Telehealth: Payer: Self-pay

## 2012-12-15 NOTE — Telephone Encounter (Signed)
Pt left v/m requesting call back; no other message. Called only contact # no answer and no v/m.

## 2012-12-15 NOTE — Telephone Encounter (Signed)
Pt called back pt wanted Dr Alphonsus Sias to know that she was increasing the Gabapentin to 200 mg tonight at bedtime. Pt wants to know how long does it take before gabapentin will help the pain?   Pt not eating due to pain; pt is trying to eat yogurt. Pt wants to know when pain should get better. Is it OK for pt to take Tylenol. Pt wants to know if Dr Alphonsus Sias has seen xray of jaw from Dr Elwyn Lade. Pt is having trouble talking due to the pain in left side of face.Please advise.

## 2012-12-15 NOTE — Telephone Encounter (Signed)
If the dentist doesn't see a bony abnormality, I still think the pain is nerve based. If the gabapentin doesn't work in 2-3 days, she should increase it to 300mg  We can continue to increase it if necessary, but I want to do it slowly so as not to overmedicate her

## 2012-12-15 NOTE — Telephone Encounter (Signed)
Patient advised.

## 2012-12-22 ENCOUNTER — Other Ambulatory Visit: Payer: Self-pay | Admitting: Internal Medicine

## 2012-12-22 NOTE — Telephone Encounter (Signed)
Pt started gabapentin 300 mg 12/18/12; pt said pain in lt side of face is better; pt is also eating soft food which has helped pain in lt side of face. pt wants to know if should continue taking gabapentin 300 mg and if so how long will pt need to be on med. Pt received letter from [employer] and Mr Woodlief will bring copy of letter for Dr Alphonsus Sias to review re; to pain meds. Pt request cb.

## 2012-12-22 NOTE — Telephone Encounter (Signed)
She should stay on this dose and may need it indefinitely. I think the pain is part of her MS and may be ongoing.  We can adjust the dose up or down depending on how she does over the next few months (and even longer)

## 2012-12-22 NOTE — Telephone Encounter (Signed)
Okay to refill for a year 

## 2012-12-22 NOTE — Telephone Encounter (Signed)
Ok to fill 

## 2012-12-23 NOTE — Telephone Encounter (Signed)
Spoke with patient and daughter and advised results

## 2012-12-27 ENCOUNTER — Other Ambulatory Visit: Payer: Self-pay | Admitting: Internal Medicine

## 2012-12-27 NOTE — Telephone Encounter (Signed)
Last filled 11/28/12 

## 2012-12-28 NOTE — Telephone Encounter (Signed)
Okay #90 x 0 

## 2012-12-28 NOTE — Telephone Encounter (Signed)
rx called into pharmacy

## 2013-01-20 ENCOUNTER — Other Ambulatory Visit: Payer: Self-pay | Admitting: Internal Medicine

## 2013-01-25 ENCOUNTER — Other Ambulatory Visit: Payer: Self-pay | Admitting: Internal Medicine

## 2013-01-25 NOTE — Telephone Encounter (Signed)
last filled 12/27/12

## 2013-01-25 NOTE — Telephone Encounter (Signed)
rx called into pharmacy

## 2013-01-25 NOTE — Telephone Encounter (Signed)
Okay #90 x 0 

## 2013-02-15 ENCOUNTER — Telehealth: Payer: Self-pay

## 2013-02-15 NOTE — Telephone Encounter (Signed)
.  left message to have patient return my call.  

## 2013-02-15 NOTE — Telephone Encounter (Signed)
Pt left v/m returning call and request cb [phone].

## 2013-02-15 NOTE — Telephone Encounter (Signed)
I would recommend the lorazepam I am not sure of the dose that would be right for her but would start with 0.5mg  1/2 bid and 1 at bedtime #60 x 3  We may need to increase the dose but it will depend on how she does   If she needs prior auth---she has end stage MS and is bed bound with anxiety and needs the med

## 2013-02-15 NOTE — Telephone Encounter (Signed)
Pt dropped off [employer] annual change of medication notice; effective 03/09/2013 alprazolam is not covered by insurance and pt request substitute med; formulary options are lorazepam,diazepam and clorazepate. See [facility] notice in Dr Karle Starch in box.

## 2013-02-16 NOTE — Telephone Encounter (Signed)
Spoke with patient and husband and they don't want the lorazepam called in until January. They will continue to fill the xanax until then, I advised they would need to call me back to call in the lorazepam, per husband he will call.

## 2013-02-16 NOTE — Telephone Encounter (Signed)
okay

## 2013-02-21 ENCOUNTER — Other Ambulatory Visit: Payer: Self-pay | Admitting: Internal Medicine

## 2013-02-21 NOTE — Telephone Encounter (Signed)
Ok to fill 

## 2013-02-21 NOTE — Telephone Encounter (Signed)
Okay to renew for a year 

## 2013-02-21 NOTE — Telephone Encounter (Signed)
rx sent to pharmacy by e-script  

## 2013-02-22 ENCOUNTER — Encounter: Payer: Self-pay | Admitting: Internal Medicine

## 2013-02-22 ENCOUNTER — Ambulatory Visit: Payer: Medicare Other | Admitting: Internal Medicine

## 2013-02-22 VITALS — BP 122/72 | HR 72 | Resp 16

## 2013-02-22 DIAGNOSIS — K219 Gastro-esophageal reflux disease without esophagitis: Secondary | ICD-10-CM

## 2013-02-22 DIAGNOSIS — J309 Allergic rhinitis, unspecified: Secondary | ICD-10-CM

## 2013-02-22 DIAGNOSIS — F411 Generalized anxiety disorder: Secondary | ICD-10-CM

## 2013-02-22 DIAGNOSIS — G8250 Quadriplegia, unspecified: Secondary | ICD-10-CM

## 2013-02-22 DIAGNOSIS — G35 Multiple sclerosis: Secondary | ICD-10-CM

## 2013-02-22 DIAGNOSIS — G501 Atypical facial pain: Secondary | ICD-10-CM

## 2013-02-22 MED ORDER — GABAPENTIN 300 MG PO CAPS: 300.0000 mg | ORAL_CAPSULE | Freq: Every day | ORAL | Status: DC

## 2013-02-22 NOTE — Assessment & Plan Note (Addendum)
No changes Now needs to void and defecate in bed Total care On the baclofen

## 2013-02-22 NOTE — Progress Notes (Signed)
Subjective:    Patient ID: Shari Santana, female    DOB: [birth year], 72 y.o.   MRN: [id number]  HPI Husband and caregiver Mardella Layman are here  Facial pain is better Trying to limit movements that seemed to trigger the pain Using at bedtime  Still satisfied with the alprazolam Insurance coverage is changing Discussed changing to lorazepam after January 1st  Concerned about the "run around" in my office when they call Discussed my chart Info given  Same status with MS bedbound Gets up with lift twice a day Stays in bed to move bowels now--not stable enough to stay on cammode  Still with rhinorrhea Hard to tell if the meds are helping but still on them She does feel it is probably better  Current Outpatient Prescriptions on File Prior to Visit  Medication Sig Dispense Refill  . ALPRAZolam (XANAX) 0.25 MG tablet TAKE 1 TABLET BY MOUTH EVERY MORNING & 2 TABLETS EVERY NIGHT AT BEDTIME  90 tablet  0  . baclofen (LIORESAL) 20 MG tablet TAKE 1 TABLET BY MOUTH FOUR TIMES A DAY ALONG WITH 10MG  TABLET  120 each  11  . ipratropium (ATROVENT) 0.03 % nasal spray Place 2 sprays into the nose 4 (four) times daily.  30 mL  11  . loratadine (CLARITIN) 10 MG tablet Take 10 mg by mouth daily as needed. For allergies       . ranitidine (ZANTAC) 150 MG tablet Take 150 mg by mouth 2 (two) times daily as needed.       No current facility-administered medications on file prior to visit.    Allergies  Allergen Reactions  . Tramadol Hcl     REACTION: mental status changes  . Bactrim [Sulfamethoxazole-Trimethoprim]     Rash    Past Medical History  Diagnosis Date  . Multiple sclerosis     severe with quadroplegia  . Arthritis   . Anxiety   . Vasomotor rhinitis   . GERD (gastroesophageal reflux disease)   . Dementia     Past Surgical History  Procedure Laterality Date  . Abdominal hysterectomy    . Vaginal delivery      x 2  . Breast lumpectomy  1964    left    No family history  on file.  History   Social History  . Marital Status: Married    Spouse Name: N/A    Number of Children: 2  . Years of Education: N/A   Occupational History  . Disabled    Social History Main Topics  . Smoking status: Never Smoker   . Smokeless tobacco: Not on file  . Alcohol Use: No  . Drug Use: Not on file  . Sexual Activity: Not on file   Other Topics Concern  . Not on file   Social History Narrative   Bedbound due to MS   Has regular caregiver in addition to husband   Son is a drug addict      Has DNR   Would not accept intubation or any mechanical ventilation   Would not accept feeding tube   Review of Systems Appetite is fine Weight seems stable Sleeps okay Bowels have been good--hasn't needed miralax lately Went to dentist--then oral Careers adviser. Needs teeth cleaning but can't tolerate due to the pain    Objective:   Physical Exam  Constitutional: No distress.  Neck: No thyromegaly present.  Cardiovascular: Normal rate, regular rhythm, normal heart sounds and intact distal pulses.  Exam reveals no gallop.  No murmur heard. Pulmonary/Chest: Effort normal and breath sounds normal. No respiratory distress. She has no wheezes. She has no rales.  Abdominal: Soft. There is no tenderness.  Musculoskeletal: She exhibits no edema.  Lymphadenopathy:    She has no cervical adenopathy.  Neurological:  Flaccid arms Slight clonus in left hand Mildly decreased tone in legs No active movement  Skin: No rash noted.  No ulcers or breakdown          Assessment & Plan:

## 2013-02-22 NOTE — Assessment & Plan Note (Signed)
Some improvement on the current meds

## 2013-02-22 NOTE — Assessment & Plan Note (Signed)
Stable at end stage Total care Lift for transfers

## 2013-02-22 NOTE — Assessment & Plan Note (Signed)
Does well with the alprazolam Will change to lorazepam for insurance after Jan 1

## 2013-02-22 NOTE — Assessment & Plan Note (Signed)
Better with the gabapentin Can try in the AM to see if it will allow caregiver to brush teeth

## 2013-02-22 NOTE — Assessment & Plan Note (Signed)
Quiet on the ranitidine 

## 2013-02-27 ENCOUNTER — Other Ambulatory Visit: Payer: Self-pay | Admitting: Internal Medicine

## 2013-02-27 NOTE — Telephone Encounter (Signed)
Last filled:01/25/13

## 2013-02-27 NOTE — Telephone Encounter (Signed)
rx called into pharmacy

## 2013-02-27 NOTE — Telephone Encounter (Signed)
Okay to send for a year 

## 2013-02-27 NOTE — Telephone Encounter (Signed)
Okay #90 x 0 

## 2013-03-29 ENCOUNTER — Other Ambulatory Visit: Payer: Self-pay | Admitting: Internal Medicine

## 2013-03-29 NOTE — Telephone Encounter (Signed)
02/27/2013  

## 2013-03-29 NOTE — Telephone Encounter (Signed)
rx called into pharmacy

## 2013-03-29 NOTE — Telephone Encounter (Signed)
Okay #90 x 0 

## 2013-04-23 ENCOUNTER — Other Ambulatory Visit: Payer: Self-pay | Admitting: Internal Medicine

## 2013-04-24 NOTE — Telephone Encounter (Signed)
rx called into pharmacy

## 2013-04-24 NOTE — Telephone Encounter (Signed)
Okay #90 x 0 

## 2013-04-24 NOTE — Telephone Encounter (Signed)
03/29/13 

## 2013-05-01 ENCOUNTER — Telehealth: Payer: Self-pay | Admitting: Internal Medicine

## 2013-05-01 NOTE — Telephone Encounter (Signed)
Patient Information:  Caller Name: Shari Santana  Phone: [phone]  Patient: Shari Santana  Gender: Female  DOB: 1941-01-08  Age: 73 Years  PCP: [facility])  Office Follow Up:  Does the office need to follow up with this patient?: Yes  Instructions For The Office: Office please review with MD and call pt back today  regarding arm and shoulder pain with movement. Pt requesting increasing Gabapentin- pt states that she doesn't come to the office; Dr Alphonsus Sias comes to the home every 3-4 months  RN Note:  Offered appt for today but she states that she can not come to the office by hx; Dr Alphonsus Sias comes to the home every 3-4 months; Would like to have a call back to know if she can increase her Gabapentin  Symptoms  Reason For Call & Symptoms: Pt is having left shoulder and arm pain with movement; pt is wanting to know if she can take more gabapentin  Reviewed Health History In EMR: Yes  Reviewed Medications In EMR: Yes  Reviewed Allergies In EMR: Yes  Reviewed Surgeries / Procedures: Yes  Date of Onset of Symptoms: 04/28/2013  Treatments Tried: Gabapentin 300mg  every morning and evening  Treatments Tried Worked: Yes  Guideline(s) Used:  Arm Pain  Disposition Per Guideline:   See Within 3 Days in Office  Reason For Disposition Reached:   Moderate pain (e.g. interferes with normal activities) and present > 3 days  Advice Given:  Rest:   You should try to avoid any exercise or activity that caused this pain for the next 3 days.  Call Back If:  You become worse.  Patient Refused Recommendation:  Patient Refused Care Advice  Office please review with MD and call pt back today  regarding arm and shoulder pain with movement. Pt requesting increasing Gabapentin

## 2013-05-01 NOTE — Telephone Encounter (Signed)
Find out how much she has been taking  I am okay with her increasing to as much as 300mg  bid (morning and afternoon) and 300-600mg  at bedtime Can refill at #120 x 11

## 2013-05-02 MED ORDER — GABAPENTIN 300 MG PO CAPS: ORAL_CAPSULE | ORAL | Status: DC

## 2013-05-02 NOTE — Telephone Encounter (Signed)
Spoke with patient's husband and she was taking 1 capsule twice daily.  Increased gabapentin and sent new rx to the pharmacy

## 2013-05-25 ENCOUNTER — Other Ambulatory Visit: Payer: Self-pay | Admitting: Internal Medicine

## 2013-05-25 NOTE — Telephone Encounter (Signed)
04/24/13 

## 2013-05-26 ENCOUNTER — Encounter: Payer: Self-pay | Admitting: Internal Medicine

## 2013-05-26 NOTE — Telephone Encounter (Signed)
Okay #90 x 0 

## 2013-05-26 NOTE — Telephone Encounter (Signed)
rx called into pharmacy

## 2013-05-31 ENCOUNTER — Encounter: Payer: Self-pay | Admitting: Internal Medicine

## 2013-05-31 ENCOUNTER — Ambulatory Visit: Payer: Medicare Other | Admitting: Internal Medicine

## 2013-05-31 VITALS — BP 114/66 | HR 92 | Resp 12

## 2013-05-31 DIAGNOSIS — G501 Atypical facial pain: Secondary | ICD-10-CM

## 2013-05-31 DIAGNOSIS — K219 Gastro-esophageal reflux disease without esophagitis: Secondary | ICD-10-CM

## 2013-05-31 DIAGNOSIS — G35 Multiple sclerosis: Secondary | ICD-10-CM

## 2013-05-31 DIAGNOSIS — G8250 Quadriplegia, unspecified: Secondary | ICD-10-CM

## 2013-05-31 DIAGNOSIS — F411 Generalized anxiety disorder: Secondary | ICD-10-CM

## 2013-05-31 MED ORDER — GABAPENTIN 600 MG PO TABS: 600.0000 mg | ORAL_TABLET | Freq: Three times a day (TID) | ORAL | Status: DC

## 2013-05-31 NOTE — Assessment & Plan Note (Signed)
Doing okay with the alprazolam

## 2013-05-31 NOTE — Progress Notes (Signed)
Subjective:    Patient ID: INDIKA COLTRAIN, female    DOB: 10-Oct-1940, 73 y.o.   MRN: [id number]  HPI Husband is here  Facial pain is worse and also pain in left arm--seems to be sensitive even to light touch She doesn't think the gabapentin is helping Husband tried 2- 81mg  aspirin and that seemed to help more. Then aspirin didn't seem to help the next time She doesn't note sedation with the daytime gabapentin  Still on alprazolam Has been covered on the insurance after all Seems to control her anxiety-- a lot of that is just related to her disability and inability to even move at all  Still voiding and defecating in bed Up with hoyer lift twice a day (10AM- 2PM, 6PM-10PM) No skin redness or breakdown  Stomach has been okay Rarely using the ranitidine lately  Current Outpatient Prescriptions on File Prior to Visit  Medication Sig Dispense Refill  . ALPRAZolam (XANAX) 0.25 MG tablet TAKE 1 TABLET BY MOUTH EVERY MORNING & 2 TABLETS EVERY NIGHT AT BEDTIME  90 tablet  0  . baclofen (LIORESAL) 20 MG tablet TAKE 1 TABLET BY MOUTH FOUR TIMES A DAY ALONG WITH 10MG  TABLET  120 each  11  . gabapentin (NEURONTIN) 300 MG capsule Take 1 capsule in the morning and 1 capsule in the afternoon and 2 at bedtime.  120 capsule  11  . ipratropium (ATROVENT) 0.03 % nasal spray Place 2 sprays into the nose 4 (four) times daily.  30 mL  11  . loratadine (CLARITIN) 10 MG tablet Take 10 mg by mouth daily as needed. For allergies       . ranitidine (ZANTAC) 150 MG tablet Take 150 mg by mouth 2 (two) times daily as needed.       No current facility-administered medications on file prior to visit.    Allergies  Allergen Reactions  . Tramadol Hcl     REACTION: mental status changes  . Bactrim [Sulfamethoxazole-Trimethoprim]     Rash    Past Medical History  Diagnosis Date  . Multiple sclerosis     severe with quadroplegia  . Arthritis   . Anxiety   . Vasomotor rhinitis   . GERD  (gastroesophageal reflux disease)     Past Surgical History  Procedure Laterality Date  . Abdominal hysterectomy    . Vaginal delivery      x 2  . Breast lumpectomy  1964    left    No family history on file.  History   Social History  . Marital Status: Married    Spouse Name: N/A    Number of Children: 2  . Years of Education: N/A   Occupational History  . Disabled    Social History Main Topics  . Smoking status: Never Smoker   . Smokeless tobacco: Not on file  . Alcohol Use: No  . Drug Use: Not on file  . Sexual Activity: Not on file   Other Topics Concern  . Not on file   Social History Narrative   Bedbound due to MS   Has regular caregiver in addition to husband   Son is a drug addict      Has DNR   Would not accept intubation or any mechanical ventilation   Would not accept feeding tube   Review of Systems Appetite is fine Does seem to have gained some weight per husband Still not able to tolerate dental care--looking into doing this under sedation. Aide has  been able to brush teeth with regular toothbrush (not power one) Sleeping fairly well     Objective:   Physical Exam  Constitutional: No distress.  Neck: Normal range of motion. No thyromegaly present.  Cardiovascular: Normal rate, regular rhythm, normal heart sounds and intact distal pulses.  Exam reveals no gallop.   No murmur heard. Pulmonary/Chest: Effort normal and breath sounds normal. No respiratory distress. She has no wheezes. She has no rales.  Abdominal: Soft. There is no tenderness.  Lymphadenopathy:    She has no cervical adenopathy.  Neurological: She is alert.  Extremities are limp (decreased tone) No contractures  Skin: No rash noted.  No ulcers  Psychiatric:  Concerned about the pain---but mood is otherwise okay          Assessment & Plan:

## 2013-05-31 NOTE — Assessment & Plan Note (Signed)
From the MS No change

## 2013-05-31 NOTE — Assessment & Plan Note (Signed)
End stage Bed bound and requires total care

## 2013-05-31 NOTE — Assessment & Plan Note (Signed)
Improved Hasn't needed the ranitidine much lately

## 2013-05-31 NOTE — Assessment & Plan Note (Signed)
And in left arm Does seem to be from the MS Discussed alternatives Will increase the gabapentin to 600 tid Okay to try aspirin If not effective, we can try hydrocodone

## 2013-06-19 ENCOUNTER — Encounter: Payer: Self-pay | Admitting: Internal Medicine

## 2013-06-25 ENCOUNTER — Other Ambulatory Visit: Payer: Self-pay | Admitting: Internal Medicine

## 2013-06-26 NOTE — Telephone Encounter (Signed)
rx called into pharmacy

## 2013-06-26 NOTE — Telephone Encounter (Signed)
Last filled 05/26/13

## 2013-06-26 NOTE — Telephone Encounter (Signed)
Okay #90 x 0 

## 2013-06-28 ENCOUNTER — Encounter: Payer: Self-pay | Admitting: Internal Medicine

## 2013-06-29 MED ORDER — NORTRIPTYLINE HCL 25 MG PO CAPS: 25.0000 mg | ORAL_CAPSULE | Freq: Every day | ORAL | Status: DC

## 2013-07-03 ENCOUNTER — Encounter: Payer: Self-pay | Admitting: Internal Medicine

## 2013-07-03 NOTE — Telephone Encounter (Signed)
No real effect from the nortriptyline but no problems Will increase to 50mg  now  Did try one of husband's tramadol with good effect If the nortriptyline doesn't work, will try tramadol

## 2013-07-23 ENCOUNTER — Other Ambulatory Visit: Payer: Self-pay | Admitting: Internal Medicine

## 2013-07-24 NOTE — Telephone Encounter (Signed)
rx called into pharmacy

## 2013-07-24 NOTE — Telephone Encounter (Signed)
Okay #90 x 0 

## 2013-07-24 NOTE — Telephone Encounter (Signed)
06/26/13 

## 2013-07-25 ENCOUNTER — Encounter: Payer: Self-pay | Admitting: Internal Medicine

## 2013-07-25 MED ORDER — NORTRIPTYLINE HCL 25 MG PO CAPS: 50.0000 mg | ORAL_CAPSULE | Freq: Every day | ORAL | Status: DC

## 2013-08-09 ENCOUNTER — Ambulatory Visit: Payer: Medicare Other | Admitting: Internal Medicine

## 2013-08-09 ENCOUNTER — Encounter: Payer: Self-pay | Admitting: Internal Medicine

## 2013-08-09 VITALS — BP 122/80 | HR 102 | Resp 16

## 2013-08-09 DIAGNOSIS — M62838 Other muscle spasm: Secondary | ICD-10-CM

## 2013-08-09 DIAGNOSIS — G35 Multiple sclerosis: Secondary | ICD-10-CM

## 2013-08-09 DIAGNOSIS — G8250 Quadriplegia, unspecified: Secondary | ICD-10-CM

## 2013-08-09 DIAGNOSIS — K219 Gastro-esophageal reflux disease without esophagitis: Secondary | ICD-10-CM

## 2013-08-09 DIAGNOSIS — G501 Atypical facial pain: Secondary | ICD-10-CM

## 2013-08-09 NOTE — Assessment & Plan Note (Signed)
Controlled with the baclofen

## 2013-08-09 NOTE — Assessment & Plan Note (Signed)
Better  Rare ranitidine

## 2013-08-09 NOTE — Progress Notes (Signed)
Subjective:    Patient ID: Shari Santana, female    DOB: [birth year], 73 y.o.   MRN: [id number]  HPI Husband is here  Arm pain is better Still with the facial pain Continues on the gabapentin Increased the nortriptyline to 50mg  recently--helps her sleep so no pain then Still has some daytime pain Has pain even with talking at times She thinks she can tolerate at the current level Used the aspirin only intermittently She has [place] info [place]  [place] about trigeminal neuralgia and suggests tegretol, trileptal and lamictal. Discussed that she is on nerve pain relievers  Notes a heavy feeling as well--doesn't scream out in pain anymore No problems chewing or swallowing  Still gets up out of bed twice a day Aide stays 9AM- 2PM 5 days per week Still voids/defecates in bed  Stomach has been okay Using the ranitidine fairly rarely  Current Outpatient Prescriptions on File Prior to Visit  Medication Sig Dispense Refill  . ALPRAZolam (XANAX) 0.25 MG tablet TAKE 1 TABLET BY MOUTH EVERY MORNING & 2 TABLETS EVERY NIGHT AT BEDTIME  90 tablet  0  . aspirin 325 MG tablet Take 325-650 mg by mouth 3 (three) times daily as needed. For pain      . baclofen (LIORESAL) 20 MG tablet TAKE 1 TABLET BY MOUTH FOUR TIMES A DAY ALONG WITH 10MG  TABLET  120 each  11  . gabapentin (NEURONTIN) 600 MG tablet Take 1 tablet (600 mg total) by mouth 3 (three) times daily.  90 tablet  11  . loratadine (CLARITIN) 10 MG tablet Take 10 mg by mouth daily as needed. For allergies       . nortriptyline (PAMELOR) 25 MG capsule Take 2 capsules (50 mg total) by mouth at bedtime.  60 capsule  5  . ranitidine (ZANTAC) 150 MG tablet Take 150 mg by mouth 2 (two) times daily as needed.       No current facility-administered medications on file prior to visit.    Allergies  Allergen Reactions  . Tramadol Hcl     REACTION: mental status changes  . Bactrim [Sulfamethoxazole-Trimethoprim]     Rash    Past Medical  History  Diagnosis Date  . Multiple sclerosis     severe with quadroplegia  . Arthritis   . Anxiety   . Vasomotor rhinitis   . GERD (gastroesophageal reflux disease)     Past Surgical History  Procedure Laterality Date  . Abdominal hysterectomy    . Vaginal delivery      x 2  . Breast lumpectomy  1964    left    No family history on file.  History   Social History  . Marital Status: Married    Spouse Name: N/A    Number of Children: 2  . Years of Education: N/A   Occupational History  . Disabled    Social History Main Topics  . Smoking status: Never Smoker   . Smokeless tobacco: Not on file  . Alcohol Use: No  . Drug Use: Not on file  . Sexual Activity: Not on file   Other Topics Concern  . Not on file   Social History Narrative   Bedbound due to MS   Has regular caregiver in addition to husband   Son is a drug addict      Has DNR   Would not accept intubation or any mechanical ventilation   Would not accept feeding tube   Review of Systems Nose has  been better---hasn't needed the ipratropium spray Appetite is fine--back to 3 meals a day usually Weight seems stable Bowels have been okay without meds     Objective:   Physical Exam  Constitutional: She appears well-nourished. No distress.  Neck: Normal range of motion. Neck supple. No thyromegaly present.  Cardiovascular: Regular rhythm, normal heart sounds and intact distal pulses.  Exam reveals no gallop.   No murmur heard. Pulmonary/Chest: Effort normal and breath sounds normal. No respiratory distress. She has no wheezes. She has no rales.  Abdominal: Soft. There is no tenderness.  Musculoskeletal: She exhibits no edema and no tenderness.  Lymphadenopathy:    She has no cervical adenopathy.  Neurological:  hypotonic  Psychiatric: She has a normal mood and affect. Her behavior is normal.          Assessment & Plan:

## 2013-08-09 NOTE — Assessment & Plan Note (Signed)
Total care Cannot do any care Some left arm pain---likely from the MS Still with husband giving care and 5d/wk caregiver No skin problems fortunately

## 2013-08-09 NOTE — Assessment & Plan Note (Signed)
Variant of trigeminal neuralgia? Better with rx but persists No sedation Continue current meds

## 2013-08-09 NOTE — Assessment & Plan Note (Signed)
End stage Total care No Rx

## 2013-08-27 ENCOUNTER — Other Ambulatory Visit: Payer: Self-pay | Admitting: Internal Medicine

## 2013-08-28 NOTE — Telephone Encounter (Signed)
Okay #90 x 0 

## 2013-08-28 NOTE — Telephone Encounter (Signed)
rx called into pharmacy

## 2013-09-25 ENCOUNTER — Other Ambulatory Visit: Payer: Self-pay | Admitting: Internal Medicine

## 2013-09-26 NOTE — Telephone Encounter (Signed)
rx called into pharmacy

## 2013-09-26 NOTE — Telephone Encounter (Signed)
Okay #90 x 0 

## 2013-09-26 NOTE — Telephone Encounter (Signed)
08/28/13 

## 2013-10-18 ENCOUNTER — Encounter: Payer: Self-pay | Admitting: Internal Medicine

## 2013-10-18 ENCOUNTER — Ambulatory Visit: Payer: Medicare Other | Admitting: Internal Medicine

## 2013-10-18 VITALS — BP 102/60 | HR 92 | Resp 16

## 2013-10-18 DIAGNOSIS — G8250 Quadriplegia, unspecified: Secondary | ICD-10-CM

## 2013-10-18 DIAGNOSIS — F411 Generalized anxiety disorder: Secondary | ICD-10-CM

## 2013-10-18 DIAGNOSIS — G501 Atypical facial pain: Secondary | ICD-10-CM

## 2013-10-18 DIAGNOSIS — G35 Multiple sclerosis: Secondary | ICD-10-CM

## 2013-10-18 DIAGNOSIS — J309 Allergic rhinitis, unspecified: Secondary | ICD-10-CM

## 2013-10-18 NOTE — Assessment & Plan Note (Signed)
Has been off the gabapentin ---not clear it was helping and husband noticed some increased confusion Not clearly better off the med but doing okay without it for the pain Now still on the nortriptyline and aleve

## 2013-10-18 NOTE — Assessment & Plan Note (Addendum)
End stage and no progression Voice is weaker--- will just need to observe Some concern about increased confusion Discussed considering hospice if she continues to decline---husband had thought of this also and would be in agreement

## 2013-10-18 NOTE — Assessment & Plan Note (Signed)
Total care Incontinent Transfers with lift

## 2013-10-18 NOTE — Assessment & Plan Note (Signed)
Using the loratadine bid Discussed trying once a day to see if it would help the dry mouth she has (could be nortriptyline also)

## 2013-10-18 NOTE — Progress Notes (Signed)
Subjective:    Patient ID: Shari Santana, female    DOB: [birth year], 73 y.o.   MRN: [id number]  HPI Husband and caregiver here Still has caregiver 9-2 daily  Voice has gotten much weaker Not even audible at times  Ongoing facial pain Not sure the gabapentin is helping Has tried aleve--once before supper. Will occasionally take it in the morning as well Not taking aspirin now Still uses the nortriptylline at bedtime---this helps her sleep ( and if she doesn't sleep it does keep her comfortable)  Has been using the zantac daily Not sure she is having heartburn but thinks it helps prevent problems No trouble swallowing--but has to be careful to eat slowly, etc  Same bed and chair bound status Needs lift to get OOB Incontinent in bed still--no attempts to use commode now Still up twice a day---always eats when up out of bed  Current Outpatient Prescriptions on File Prior to Visit  Medication Sig Dispense Refill  . ALPRAZolam (XANAX) 0.25 MG tablet TAKE 1 TABLET BY MOUTH IN THE MORNING AND 2 TABLETS AT BEDTIME.  90 tablet  0  . baclofen (LIORESAL) 20 MG tablet TAKE 1 TABLET BY MOUTH FOUR TIMES A DAY ALONG WITH 10MG  TABLET  120 each  11  . gabapentin (NEURONTIN) 600 MG tablet Take 1 tablet (600 mg total) by mouth 3 (three) times daily.  90 tablet  11  . ipratropium (ATROVENT) 0.03 % nasal spray Place 2 sprays into the nose 4 (four) times daily as needed.      . loratadine (CLARITIN) 10 MG tablet Take 10 mg by mouth daily as needed. For allergies       . nortriptyline (PAMELOR) 25 MG capsule Take 2 capsules (50 mg total) by mouth at bedtime.  60 capsule  5  . ranitidine (ZANTAC) 150 MG tablet Take 150 mg by mouth 2 (two) times daily as needed.       No current facility-administered medications on file prior to visit.    Allergies  Allergen Reactions  . Tramadol Hcl     REACTION: mental status changes  . Bactrim [Sulfamethoxazole-Trimethoprim]     Rash    Past Medical  History  Diagnosis Date  . Multiple sclerosis     severe with quadroplegia  . Arthritis   . Anxiety   . Vasomotor rhinitis   . GERD (gastroesophageal reflux disease)     Past Surgical History  Procedure Laterality Date  . Abdominal hysterectomy    . Vaginal delivery      x 2  . Breast lumpectomy  1964    left    No family history on file.  History   Social History  . Marital Status: Married    Spouse Name: N/A    Number of Children: 2  . Years of Education: N/A   Occupational History  . Disabled    Social History Main Topics  . Smoking status: Never Smoker   . Smokeless tobacco: Not on file  . Alcohol Use: No  . Drug Use: Not on file  . Sexual Activity: Not on file   Other Topics Concern  . Not on file   Social History Narrative   Bedbound due to MS   Has regular caregiver in addition to husband   Son is a drug addict      Has DNR   Would not accept intubation or any mechanical ventilation   Would not accept feeding tube   Review of Systems  Appetite is fine--now eating 3 meals per day Weight seems to be stable Bowels have been fine Rhinorrhea not a big problem lately--uses the loratadine at times but not the nasal spray    Objective:   Physical Exam  Constitutional: No distress.  Low volume voice--at times inaudible but mostly understandable  Neck: No thyromegaly present.  Cardiovascular: Normal rate, regular rhythm and normal heart sounds.  Exam reveals no gallop.   No murmur heard. Pulmonary/Chest: Effort normal and breath sounds normal. No respiratory distress. She has no wheezes. She has no rales.  Abdominal: Soft. She exhibits no distension. There is no tenderness. There is no rebound and no guarding.  Musculoskeletal: She exhibits no edema and no tenderness.  Mild contracture of left knee  Lymphadenopathy:    She has no cervical adenopathy.  Neurological:  Decreased tone No motor strength in extremities  Skin:  Facial keratoses    Psychiatric: She has a normal mood and affect. Her behavior is normal.          Assessment & Plan:

## 2013-10-18 NOTE — Assessment & Plan Note (Signed)
Does okay with alprazolam mostly at night

## 2013-10-24 ENCOUNTER — Encounter: Payer: Self-pay | Admitting: Internal Medicine

## 2013-10-24 ENCOUNTER — Other Ambulatory Visit: Payer: Self-pay | Admitting: Family Medicine

## 2013-10-24 MED ORDER — ALPRAZOLAM 0.25 MG PO TABS: ORAL_TABLET | ORAL | Status: DC

## 2013-10-26 ENCOUNTER — Other Ambulatory Visit: Payer: Self-pay | Admitting: Internal Medicine

## 2013-10-26 MED ORDER — ALPRAZOLAM 0.25 MG PO TABS: ORAL_TABLET | ORAL | Status: DC

## 2013-10-26 NOTE — Addendum Note (Signed)
Addended by: Patience Musca on: 10/26/2013 11:18 AM   Modules accepted: Orders

## 2013-10-26 NOTE — Telephone Encounter (Signed)
Duplicate; see 10/24/13 phone note.

## 2013-10-26 NOTE — Telephone Encounter (Signed)
[name] said Shari Santana does not have alprazolam refill on 10/24/13; spoke with [place] at [facility]; Medication phoned to [facility] pharmacy as instructed. [name] will pick up at [facility].

## 2013-10-27 ENCOUNTER — Encounter: Payer: Self-pay | Admitting: Internal Medicine

## 2013-10-27 NOTE — Telephone Encounter (Signed)
Discussed with husband and patient on speaker Will increase the nortriptyline to 75mg  at bedtime for 3 days and then up to 100mg  as needed  May need to consider trying depakote or lamictal despite not being able to check blood work--but I am hopeful the nortriptyline in higher dose will be effective (it did really seem to help when she started it)  They will let me know and then I can refill at higher ddose

## 2013-11-15 ENCOUNTER — Encounter: Payer: Self-pay | Admitting: Internal Medicine

## 2013-11-15 NOTE — Telephone Encounter (Signed)
Ok to send

## 2013-11-16 MED ORDER — NORTRIPTYLINE HCL 50 MG PO CAPS: 100.0000 mg | ORAL_CAPSULE | Freq: Every day | ORAL | Status: DC

## 2013-11-16 NOTE — Telephone Encounter (Signed)
rx sent to pharmacy by e-script  

## 2013-11-26 ENCOUNTER — Other Ambulatory Visit: Payer: Self-pay | Admitting: Internal Medicine

## 2013-11-27 NOTE — Telephone Encounter (Signed)
10/2013 

## 2013-11-28 MED ORDER — ALPRAZOLAM 0.25 MG PO TABS: ORAL_TABLET | ORAL | Status: DC

## 2013-11-28 NOTE — Telephone Encounter (Signed)
rx called into pharmacy

## 2013-11-28 NOTE — Telephone Encounter (Signed)
Okay #90 x 0 

## 2013-11-28 NOTE — Telephone Encounter (Signed)
Duplicate Okay #90 x 0

## 2013-12-11 ENCOUNTER — Encounter: Payer: Self-pay | Admitting: Internal Medicine

## 2013-12-11 ENCOUNTER — Telehealth: Payer: Self-pay | Admitting: Internal Medicine

## 2013-12-11 NOTE — Telephone Encounter (Signed)
Phone call with patient and husband The jaw pain is worse Still on the 2 nortriptylline for bedtime Husband did give her gabapentin last night--not clear if it helped  Advised her to try back on the gabapentin 600 tid again---along with the nortriptylline

## 2013-12-18 ENCOUNTER — Encounter: Payer: Self-pay | Admitting: Internal Medicine

## 2013-12-19 ENCOUNTER — Encounter: Payer: Self-pay | Admitting: Internal Medicine

## 2013-12-20 MED ORDER — NORTRIPTYLINE HCL 50 MG PO CAPS: 100.0000 mg | ORAL_CAPSULE | Freq: Every day | ORAL | Status: DC

## 2013-12-20 MED ORDER — TRAMADOL HCL 50 MG PO TABS: 25.0000 mg | ORAL_TABLET | Freq: Three times a day (TID) | ORAL | Status: DC | PRN

## 2013-12-20 NOTE — Telephone Encounter (Signed)
Discussed ongoing pain ?some initial help back on the gabapentin but now worse again  P: increase nortriptylline to 50AM, 100PM     Trial tramadol 25-50 tid prn

## 2013-12-20 NOTE — Telephone Encounter (Signed)
Pt's spouse called and had not heard response to e-mail re: her additional pain.  Explained provider had not been in office.  They have increased the gabapentin (NEURONTIN) 600 MG tablet [[id number]] to three times daily, and the nortriptyline (PAMELOR) 50 MG at night only.  Posey Jacobe wanted to know if they could increase the gabapentin to 4 times daily or change medication to "something more powerful".  Best number to call is cell number of [phone] . Pharmacy of choice is [facility]

## 2013-12-22 ENCOUNTER — Encounter (HOSPITAL_COMMUNITY): Payer: Self-pay | Admitting: Emergency Medicine

## 2013-12-22 ENCOUNTER — Telehealth: Payer: Self-pay | Admitting: Internal Medicine

## 2013-12-22 ENCOUNTER — Emergency Department (HOSPITAL_COMMUNITY)
Admission: EM | Admit: 2013-12-22 | Discharge: 2013-12-23 | Disposition: A | Discharge status: Home / Self Care | Payer: Medicare Other | Attending: Emergency Medicine | Admitting: Emergency Medicine

## 2013-12-22 ENCOUNTER — Emergency Department (HOSPITAL_COMMUNITY): Payer: Medicare Other

## 2013-12-22 DIAGNOSIS — G35 Multiple sclerosis: Secondary | ICD-10-CM | POA: Diagnosis not present

## 2013-12-22 DIAGNOSIS — M1990 Unspecified osteoarthritis, unspecified site: Secondary | ICD-10-CM | POA: Insufficient documentation

## 2013-12-22 DIAGNOSIS — F419 Anxiety disorder, unspecified: Secondary | ICD-10-CM | POA: Diagnosis not present

## 2013-12-22 DIAGNOSIS — G500 Trigeminal neuralgia: Secondary | ICD-10-CM | POA: Diagnosis not present

## 2013-12-22 DIAGNOSIS — R000 Tachycardia, unspecified: Secondary | ICD-10-CM | POA: Diagnosis not present

## 2013-12-22 DIAGNOSIS — Z79899 Other long term (current) drug therapy: Secondary | ICD-10-CM | POA: Insufficient documentation

## 2013-12-22 DIAGNOSIS — K219 Gastro-esophageal reflux disease without esophagitis: Secondary | ICD-10-CM | POA: Diagnosis not present

## 2013-12-22 DIAGNOSIS — G501 Atypical facial pain: Secondary | ICD-10-CM

## 2013-12-22 DIAGNOSIS — H5712 Ocular pain, left eye: Secondary | ICD-10-CM | POA: Diagnosis present

## 2013-12-22 LAB — I-STAT CHEM 8, ED
BUN: 19 mg/dL (ref 6–23)
CALCIUM ION: 1.13 mmol/L (ref 1.13–1.30)
CREATININE: 0.3 mg/dL — AB (ref 0.50–1.10)
Chloride: 96 mEq/L (ref 96–112)
GLUCOSE: 101 mg/dL — AB (ref 70–99)
HCT: 52 % — ABNORMAL HIGH (ref 36.0–46.0)
HEMOGLOBIN: 17.7 g/dL — AB (ref 12.0–15.0)
Potassium: 3.8 mEq/L (ref 3.7–5.3)
Sodium: 137 mEq/L (ref 137–147)
TCO2: 34 mmol/L (ref 0–100)

## 2013-12-22 MED ORDER — FLUORESCEIN SODIUM 1 MG OP STRP
1.0000 | ORAL_STRIP | Freq: Once | OPHTHALMIC | Status: AC
  Administered 2013-12-22: 1 via OPHTHALMIC
  Filled 2013-12-22: qty 1

## 2013-12-22 MED ORDER — IOHEXOL 300 MG/ML  SOLN
100.0000 mL | Freq: Once | INTRAMUSCULAR | Status: AC | PRN
  Administered 2013-12-22: 100 mL via INTRAVENOUS

## 2013-12-22 MED ORDER — TETRACAINE HCL 0.5 % OP SOLN
1.0000 [drp] | Freq: Once | OPHTHALMIC | Status: AC
  Administered 2013-12-22: 2 [drp] via OPHTHALMIC
  Filled 2013-12-22: qty 2

## 2013-12-22 NOTE — Telephone Encounter (Signed)
Patient Information:  [name]  Phone: [phone]  Patient: Shari Santana, Shari Santana  Gender: Female  DOB: Nov 03, 1940  Age: 73 Years  PCP: Tillman Abide [facility])  Office Follow Up:  Does the office need to follow up with this patient?: No  Instructions For The Office: N/A  RN Note:  Spouse states patient has hx of chronic left jaw and eye pain. States patient is prescribed Gabapentin 600mg . TID and Pamelor 100mg . at HS. Spouse states left jaw and left eye pain increased 12/20/13. States Dr. Alphonsus Sias was notified and Patient was prescribed Tramadol 50mg . 0.5-tablet TID. States he was advised to add Pamelor 50mg . in the a.m. Spouse states patient has been receiving Tramadol 50mg . TID, Gabapentin 600mg . TID, Pamelor 50mg . in the a.m. and Pamelor 100mg . at HS without relief of facial pain. States patient continues to complain of severe facial pain. No facial swelling noted. Spouse states no rashes, lesions or blisters noted. Patient is afebrile. Patient denies chest pain or shortness of breath. RN spoke with Marcelline Mates, in office. Marcelline Mates states, per order of Dr. Ermalene Searing, Patient to go to the ED for evaluation.  Spouse informed of above and agreeable.  Symptoms  Reason For Call & Symptoms: Left jaw and eye pain  Reviewed Health History In EMR: Yes  Reviewed Medications In EMR: Yes  Reviewed Allergies In EMR: Yes  Reviewed Surgeries / Procedures: Yes  Date of Onset of Symptoms: 12/20/2013  Treatments Tried: Pamelor, Gabapentin, Tramadol  Treatments Tried Worked: No  Guideline(s) Used:  Face Pain  Disposition Per Guideline:   Go to Office Now  Reason For Disposition Reached:   Severe pain (e.g., excruciating, unable to do any normal activities)  Advice Given:  N/A  RN Overrode Recommendation:  Go To ED  Per order of Dr Ermalene Searing, Spouse advised to take patient to the ED for evaluation. Spouse verbalizes understanding and agreeable. Spouse states he will take patient  to the ED at [facility].

## 2013-12-22 NOTE — Telephone Encounter (Signed)
Agree with urgent care/ED visit.

## 2013-12-22 NOTE — ED Notes (Signed)
Pt. Family member came up to triage desk and asked about room status. Told him that we had called and paged patient with no response. Pt. Family states that pager never went off. Pt. Placed back into waiting room

## 2013-12-22 NOTE — ED Notes (Signed)
Pts name called for a room no answer 

## 2013-12-22 NOTE — ED Provider Notes (Signed)
CSN: [id number]     Arrival date & time 12/22/13  1811 History   First MD Initiated Contact with Patient 12/22/13 2117     Chief Complaint  Patient presents with  . Eye Pain     (Consider location/radiation/quality/duration/timing/severity/associated sxs/prior Treatment) The history is provided by the patient, medical records, a relative and a significant other. No language interpreter was used.    Shari Santana is a 73 y.o. female  with a hx of MS, arthritis, anxiety, GERD presents to the Emergency Department complaining of intermittent progressively worsening left eye pain onset 2-3 months ago.  Pt and family report that the pain is stabbing or shooting in nature and is occurring up to every 30min in the last few weeks. She has been taking baclofen, gabapentin and tramadol without relief.  Pt denies pain in the eye, rash to the face, dental pain, throat pain or dysphagia.  No other associated symptoms.  Nothing makes it better and palpation and movement of the face makes it worse.  Pt denies fever, chills, headache, neck pain, chest pain, shortness of breath, abdominal pain, nausea, vomiting diarrhea, weakness, dizziness, syncope, rash.  Patient and family report significant anxiety about the pain.     Past Medical History  Diagnosis Date  . Multiple sclerosis     severe with quadroplegia  . Arthritis   . Anxiety   . Vasomotor rhinitis   . GERD (gastroesophageal reflux disease)    Past Surgical History  Procedure Laterality Date  . Abdominal hysterectomy    . Vaginal delivery      x 2  . Breast lumpectomy  1964    left   History reviewed. No pertinent family history. History  Substance Use Topics  . Smoking status: Never Smoker   . Smokeless tobacco: Not on file  . Alcohol Use: No   OB History   Grav Para Term Preterm Abortions TAB SAB Ect Mult Living                 Review of Systems  Constitutional: Negative for fever, diaphoresis, appetite change, fatigue and  unexpected weight change.  HENT: Negative for mouth sores.        Facial pain  Eyes: Negative for visual disturbance.  Respiratory: Negative for cough, chest tightness, shortness of breath and wheezing.   Cardiovascular: Negative for chest pain.  Gastrointestinal: Negative for nausea, vomiting, abdominal pain, diarrhea and constipation.  Endocrine: Negative for polydipsia, polyphagia and polyuria.  Genitourinary: Negative for dysuria, urgency, frequency and hematuria.  Musculoskeletal: Negative for back pain and neck stiffness.  Skin: Negative for rash.  Allergic/Immunologic: Negative for immunocompromised state.  Neurological: Negative for syncope, light-headedness and headaches.  Hematological: Does not bruise/bleed easily.  Psychiatric/Behavioral: Negative for sleep disturbance. The patient is not nervous/anxious.       Allergies  Tramadol hcl and Bactrim  Home Medications   Prior to Admission medications   Medication Sig Start Date End Date Taking? Authorizing Provider  ALPRAZolam (XANAX) 0.25 MG tablet Take 0.25-0.5 mg by mouth 2 (two) times daily. Takes 1 tab in am and 2 tabs at bedtime   Yes Historical Provider, MD  aspirin 325 MG EC tablet Take 325 mg by mouth every 6 (six) hours as needed for pain.   Yes Historical Provider, MD  baclofen (LIORESAL) 20 MG tablet Take 30 mg by mouth 4 (four) times daily.   Yes Historical Provider, MD  gabapentin (NEURONTIN) 600 MG tablet Take 600 mg by mouth 3 (  three) times daily.   Yes Historical Provider, MD  loratadine (CLARITIN) 10 MG tablet Take 10 mg by mouth at bedtime. For allergies   Yes Historical Provider, MD  nortriptyline (PAMELOR) 50 MG capsule Take 2 capsules (100 mg total) by mouth at bedtime. 12/20/13  Yes Karie Schwalbe, MD  ranitidine (ZANTAC) 150 MG tablet Take 150 mg by mouth daily as needed for heartburn.   Yes Historical Provider, MD  traMADol (ULTRAM) 50 MG tablet Take 0.5-1 tablets (25-50 mg total) by mouth 3 (three)  times daily as needed. 12/20/13  Yes Karie Schwalbe, MD  carbamazepine (TEGRETOL) 200 MG tablet Take 0.5 tablets (100 mg total) by mouth 2 (two) times daily. 12/23/13   Evian Salguero, PA-C   BP 167/89  Pulse 117  Temp(Src) 98.7 F (37.1 C) (Oral)  Resp 20  SpO2 93% Physical Exam  Nursing note and vitals reviewed. Constitutional: She is oriented to person, place, and time. She appears well-developed and well-nourished. No distress.  HENT:  Head: Normocephalic and atraumatic.  Right Ear: Tympanic membrane, external ear and ear canal normal.  Left Ear: Tympanic membrane, external ear and ear canal normal.  Nose: Nose normal. No mucosal edema or rhinorrhea. No epistaxis. Right sinus exhibits no maxillary sinus tenderness and no frontal sinus tenderness. Left sinus exhibits no maxillary sinus tenderness and no frontal sinus tenderness.  Mouth/Throat: Uvula is midline, oropharynx is clear and moist and mucous membranes are normal. Mucous membranes are not pale and not cyanotic. No oral lesions. Normal dentition. No uvula swelling, lacerations or dental caries. No oropharyngeal exudate, posterior oropharyngeal edema, posterior oropharyngeal erythema or tonsillar abscesses.  No pain to palpation of the temporal artery No bruit of the temporal artery No rash to the face, scalp or ear canal - no vesicles No oral lesions No TTP of the teeth No significant dental caries or gross abscess  Eyes: Conjunctivae, EOM and lids are normal. Pupils are equal, round, and reactive to light. Lids are everted and swept, no foreign bodies found. Right eye exhibits no chemosis, no discharge and no exudate. No foreign body present in the right eye. Left eye exhibits no chemosis, no discharge and no exudate. No foreign body present in the left eye. Right conjunctiva is not injected. Right conjunctiva has no hemorrhage. Left conjunctiva is not injected. Left conjunctiva has no hemorrhage.  Slit lamp exam:       The right eye shows no corneal abrasion, no corneal flare, no corneal ulcer, no foreign body, no fluorescein uptake and no anterior chamber bulge.       The left eye shows no corneal abrasion, no corneal flare, no corneal ulcer, no foreign body, no fluorescein uptake and no anterior chamber bulge.  Pupils equal round and reactive to light No vertical, horizontal or rotational nystagmus No corneal abrasion noted to the left eye No visible foreign body No corneal flare, ulcer or dendritic staining  No herpetic lesions to the face or around the eye Tono: 15 in the Left eye  Neck: Normal range of motion and full passive range of motion without pain. Neck supple.  Full range of motion without pain No midline or paraspinal tenderness No nuchal rigidity; no meningeal signs  Cardiovascular: Normal rate, regular rhythm, normal heart sounds and intact distal pulses.   No murmur heard. tachycardia  Pulmonary/Chest: Effort normal and breath sounds normal. No stridor. No respiratory distress. She has no wheezes.  Clear and equal breath sounds without focal wheezes, rhonchi, rales  Abdominal: Soft. Bowel sounds are normal. She exhibits no distension. There is no tenderness.  Musculoskeletal: Normal range of motion.  Lymphadenopathy:    She has no cervical adenopathy.  Neurological: She is alert and oriented to person, place, and time.  Mental Status:  Alert, oriented, thought content appropriate. Speech fluent without evidence of aphasia. Able to follow 2 step commands without difficulty.   Cranial Nerves:  II:  Peripheral visual fields grossly normal, pupils equal, round, reactive to light III,IV, VI: ptosis not present, extra-ocular motions intact bilaterally  V,VII: smile symmetric, facial light touch sensation equal VIII: hearing grossly normal bilaterally  IX,X: gag reflex present  XI: bilateral shoulder shrug equal and strong XII: midline tongue extension   Skin: Skin is warm and dry. No  rash noted. She is not diaphoretic. No erythema.  Psychiatric: Her mood appears anxious.  Pt very anxious    ED Course  Procedures (including critical care time) Labs Review Labs Reviewed  I-STAT CHEM 8, ED - Abnormal; Notable for the following:    Creatinine, Ser 0.30 (*)    Glucose, Bld 101 (*)    Hemoglobin 17.7 (*)    HCT 52.0 (*)    All other components within normal limits    Imaging Review Ct Maxillofacial W/cm  12/22/2013   CLINICAL DATA:  Left eye pain.  History of multiple sclerosis.  EXAM: CT MAXILLOFACIAL WITH CONTRAST  TECHNIQUE: Multidetector CT imaging of the maxillofacial structures was performed with intravenous contrast. Multiplanar CT image reconstructions were also generated. A small metallic BB was placed on the right temple in order to reliably differentiate right from left.  CONTRAST:  OMNIPAQUE IOHEXOL 300 MG/ML  SOLN  COMPARISON:  None.  FINDINGS: The orbits are unremarkable. There is no evidence for abscess, cellulitis, or mass. The extraocular muscles are symmetric and unremarkable. Normal appearance of the globes. No facial fracture.  The tongue appears deviated towards the left, but evaluation of the intrinsic muscles for atrophy or edema is limited due to extensive streak artifact from dental amalgam. This will be re-evaluated on neck CT.  Limited intracranial imaging notable for prominent brain atrophy with ex vacuo ventricular enlargement.  There is a lobulated, enhancing mass within the tail of the parotid measuring up to 18 mm. The enhancement is mildly heterogeneous, and shape is irregular. The small area of skin thickening along the left cheek.  IMPRESSION: 1. No orbital abscess or other acute finding. 2. 18 mm mass in the left parotid tail which could represent adenopathy or primary parotid neoplasm. Recommend detailed skin exam in the left scalp and ear. Nonemergent neck CT with contrast recommended.   Electronically Signed   By: Tiburcio Pea M.D.    On: 12/22/2013 23:54     EKG Interpretation None      MDM   Final diagnoses:  Trigeminal neuralgia of left side of face  Atypical facial pain   DAVONNA ERTL presents with left facial pain, intermittent for several months, but worsening over time.  Pt symptoms are not consistent with dental abscess, acute angle glaucoma, hordeolum, shingles, temporal arteritis, meningitis.    Suspect trigeminal neuralgia.    Will obtain CT maxillofacial to r/o sinusitis and subacute dental abscess.    12:26 AM CT with no orbital abscess or dental abscess.  Incidental finding of 18 mm mass in the left parotid tail which could represent adenopathy or primary parotid neoplasm.  Will stop gabapentin and begin Tegretol for trigeminal neuralgia.  The patient was discussed with and seen by Dr. Blinda Leatherwood who agrees with the treatment plan.  I have personally reviewed patient's vitals, nursing note and any pertinent labs or imaging.  I performed an undressed physical exam.    It has been determined that no acute conditions requiring further emergency intervention are present at this time. The patient/guardian have been advised of the diagnosis and plan. I reviewed all labs and imaging including any potential incidental findings (18 mm mass in the left parotid tail which could represent adenopathy or primary parotid neoplasm). We have discussed signs and symptoms that warrant return to the ED, such as high fevers, rash.  Patient/guardian has voiced understanding and agreed to follow-up with the PCP or specialist in 3 days.  Vital signs are stable at discharge. Pt with persistent mild tachycardia, likely 2/2 to her significant anxiety.    BP 167/89  Pulse 117  Temp(Src) 98.7 F (37.1 C) (Oral)  Resp 20  SpO2 93%        Dierdre Forth, PA-C 12/23/13 0031

## 2013-12-22 NOTE — ED Notes (Signed)
Pt c/o left eye pain; pt with hx of MS and per husband pt with pain when eating or drinking in left side of mouth

## 2013-12-22 NOTE — ED Notes (Signed)
Pt to CT at this time.

## 2013-12-23 ENCOUNTER — Other Ambulatory Visit: Payer: Self-pay | Admitting: Internal Medicine

## 2013-12-23 MED ORDER — CARBAMAZEPINE 200 MG PO TABS: 100.0000 mg | ORAL_TABLET | Freq: Two times a day (BID) | ORAL | Status: AC

## 2013-12-23 NOTE — ED Provider Notes (Signed)
Patient presented to the ER with pain in the side of the face. Patient has been experiencing symptoms for proximally a month. She reports a sharp stabbing, lancinating pain across the left side of her face around the left thigh. She describes it as "a bolt of lightning"  Face to face Exam: HEENT - PERRLA Lungs - CTAB Heart - RRR, no M/R/G Abd - S/NT/ND Neuro - alert, oriented x3  Plan: Basic labs, CT scan of face to further evaluate. CT does not show any abscess formation or other acute abnormality. Symptoms are most consistent with trigeminal neuralgia. TC chemotherapy, try Tegretol. Increase analgesia and followup with her primary neurologist.   Gilda Crease, MD 12/23/13 [phone]

## 2013-12-23 NOTE — ED Provider Notes (Signed)
Medical screening examination/treatment/procedure(s) were conducted as a shared visit with non-physician practitioner(s) and myself.  I personally evaluated the patient during the encounter.  Please see separate associated note for evaluation and plan.    EKG Interpretation None       Gilda Creasehristopher J. Dacota Devall, MD 12/23/13 [phone]

## 2013-12-23 NOTE — Discharge Instructions (Signed)
1. Medications: begin tegretol, usual home medications 2. Treatment: rest, drink plenty of fluids, stop gabapentin 3. Follow Up: Please followup with your primary doctor in 3 days for discussion of your diagnoses and further evaluation after today's visit; if you do not have a primary care doctor use the resource guide provided to find one;     Trigeminal Neuralgia Trigeminal neuralgia is a nerve disorder that causes sudden attacks of severe facial pain. It is caused by damage to the trigeminal nerve, a major nerve in the face. It is more common in women and in the elderly, although it can also happen in younger patients. Attacks last from a few seconds to several minutes and can occur from a couple of times per year to several times per day. Trigeminal neuralgia can be a very distressing and disabling condition. Surgery may be needed in very severe cases if medical treatment does not give relief. HOME CARE INSTRUCTIONS   If your caregiver prescribed medication to help prevent attacks, take as directed.  To help prevent attacks:  Chew on the unaffected side of the mouth.  Avoid touching your face.  Avoid blasts of hot or cold air.  Men may wish to grow a beard to avoid having to shave. SEEK IMMEDIATE MEDICAL CARE IF:  Pain is unbearable and your medicine does not help.  You develop new, unexplained symptoms (problems).  You have problems that may be related to a medication you are taking. Document Released: 02/21/2000 Document Revised: 05/18/2011 Document Reviewed: 12/21/2008 [facility] Patient Information 2015 [place], [place]. This information is not intended to replace advice given to you by your health care provider. Make sure you discuss any questions you have with your health care provider.

## 2013-12-25 ENCOUNTER — Other Ambulatory Visit: Payer: Self-pay | Admitting: Internal Medicine

## 2013-12-25 ENCOUNTER — Encounter: Payer: Self-pay | Admitting: Internal Medicine

## 2013-12-25 NOTE — Telephone Encounter (Signed)
Notes are in chart

## 2013-12-25 NOTE — Telephone Encounter (Signed)
rx called into pharmacy

## 2013-12-25 NOTE — Telephone Encounter (Signed)
Okay #90 x 0 

## 2013-12-25 NOTE — Telephone Encounter (Signed)
11/28/13 

## 2013-12-26 ENCOUNTER — Encounter: Payer: Self-pay | Admitting: Internal Medicine

## 2014-01-02 ENCOUNTER — Ambulatory Visit: Payer: Medicare Other | Admitting: Internal Medicine

## 2014-01-02 ENCOUNTER — Encounter: Payer: Self-pay | Admitting: Internal Medicine

## 2014-01-02 VITALS — BP 122/78 | HR 106 | Resp 12

## 2014-01-02 DIAGNOSIS — K118 Other diseases of salivary glands: Secondary | ICD-10-CM | POA: Insufficient documentation

## 2014-01-02 DIAGNOSIS — G35 Multiple sclerosis: Secondary | ICD-10-CM

## 2014-01-02 DIAGNOSIS — R220 Localized swelling, mass and lump, head: Secondary | ICD-10-CM

## 2014-01-02 DIAGNOSIS — G8250 Quadriplegia, unspecified: Secondary | ICD-10-CM

## 2014-01-02 DIAGNOSIS — G501 Atypical facial pain: Secondary | ICD-10-CM

## 2014-01-02 DIAGNOSIS — Z23 Encounter for immunization: Secondary | ICD-10-CM

## 2014-01-02 NOTE — Assessment & Plan Note (Signed)
Presumed trigeminal neuralgia from the MS but now with parotid mass---this could be related No success with gabapentin, nortriptylline (brief for each but then stopped working) Not sure if the parotid mass could be related

## 2014-01-02 NOTE — Addendum Note (Signed)
Addended by: Sueanne Margarita on: 01/02/2014 03:23 PM   Modules accepted: Orders

## 2014-01-02 NOTE — Progress Notes (Signed)
Subjective:    Patient ID: Shari Santana, female    DOB: September 25, 1940, 73 y.o.   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 ALPRAZolam (XANAX) 0.25 MG tablet TAKE 1 TABLET BY MOUTH IN THE MORNING AND 2 TABLETS IN THE EVENING  90 tablet  0  . aspirin 325 MG EC tablet Take 325 mg by mouth every 6 (six) hours as needed for pain.      . baclofen (LIORESAL) 10 MG tablet TAKE ONE TABLET BY MOUTH FOUR TIMES DAILY  120 each  3  . baclofen (LIORESAL) 20 MG tablet Take 30 mg by mouth 4 (four) times daily.      . carbamazepine (TEGRETOL) 200 MG tablet Take 0.5 tablets (100 mg total) by mouth 2 (two) times daily.  30 tablet  0  . loratadine (CLARITIN) 10 MG tablet Take 10 mg by mouth at bedtime. For allergies      . nortriptyline (PAMELOR) 50 MG capsule Take 2 capsules (100 mg total) by mouth at bedtime.  90 capsule  11  . ranitidine (ZANTAC) 150 MG tablet Take 150 mg by mouth daily as needed for heartburn.       No current facility-administered medications on file prior to visit.    Allergies  Allergen Reactions  . Tramadol Hcl Other (See Comments)    REACTION: mental status changes  . Bactrim [Sulfamethoxazole-Trimethoprim] Itching and Rash    Past Medical History  Diagnosis  Date  . Multiple sclerosis     severe with quadroplegia  . Arthritis   . Anxiety   . Vasomotor rhinitis   . GERD (gastroesophageal reflux disease)     Past Surgical History  Procedure Laterality Date  . Abdominal hysterectomy    . Vaginal delivery      x 2  . Breast lumpectomy  1964    left    No family history on file.  History   Social History  . Marital Status: Married    Spouse Name: N/A    Number of Children: 2  . Years of Education: N/A   Occupational History  . Disabled    Social History Main Topics  . Smoking status: Never Smoker   . Smokeless tobacco: Not on file  . Alcohol Use: No  . Drug Use: Not on file  . Sexual Activity: Not on file   Other Topics Concern  . Not on file   Social History Narrative   Bedbound due to MS   Has regular caregiver in addition to husband   Son is a drug addict      Has DNR   Would not accept intubation or any mechanical ventilation   Would not accept feeding tube   Review of  Systems Doesn't seem to be sleeping well--no longer getting to sleep easily Bowels have been slow--- every 3-4 days at best. Relates this to lack of intake    Objective:   Physical Exam  Constitutional: No distress.  Neck:  Firm or even hard left parotid gland Not tender  Cardiovascular: Regular rhythm and normal heart sounds.  Exam reveals no gallop.   No murmur heard. Mild tachycardia  Pulmonary/Chest: Effort normal and breath sounds normal. No respiratory distress. She has no wheezes. She has no rales.  Abdominal: Soft. There is no tenderness.  Musculoskeletal: She exhibits no edema.  Lymphadenopathy:    She has no cervical adenopathy.  Neurological:  Doesn't seem to understand Few low volume words Sleepy Doesn't engage like she usually does  Hypotonic extremities as usual No active movement in extremities and not really moving head much  Skin:  No ulcers          Assessment & Plan:

## 2014-01-02 NOTE — Assessment & Plan Note (Signed)
May be related to the facial pain It is hard and I fear cancer---it could be part of the reason for the more recalcitrance of the pain We will not pursue biopsy or further eval per family discussion

## 2014-01-02 NOTE — Assessment & Plan Note (Signed)
From MS Still getting out of bed via lift but not able to tolerate it as well

## 2014-01-02 NOTE — Assessment & Plan Note (Addendum)
Clearly worse Sig cognitive decline and unable to speak and swallow as well May be from the MS or ?? Side effects of med  Discussed this with husband and daughter (who came in during the visit) Discussed options--will proceed with hospice referral

## 2014-01-03 ENCOUNTER — Telehealth: Payer: Self-pay

## 2014-01-03 MED ORDER — MORPHINE SULFATE (CONCENTRATE) 10 MG /0.5 ML PO SOLN: 5.0000 mg | ORAL | Status: AC | PRN

## 2014-01-03 NOTE — Telephone Encounter (Signed)
Spoke to husband, then Shari Santana She was clearer today but pain much worse Shari Santana had them increase the tramadol to a full tab, and take another alprazolam  I told husband to restart the nortriptylline Rx for roxanol for better relief--if this helps, will consider fentanyl patch Should get support with hospice presence --they are clear about no more hospital/ER visits May need to consider hospice home if she continues to deteriorate

## 2014-01-03 NOTE — Telephone Encounter (Signed)
Sue Lush of [facility] saw pt to start hospice services and Sue Lush request cb for pain med. Pt's pain level this AM is 10 out of 10.Please advise.

## 2014-01-04 ENCOUNTER — Telehealth: Payer: Self-pay | Admitting: *Deleted

## 2014-01-04 MED ORDER — ATROPINE SULFATE 1 % OP SOLN: 1.0000 [drp] | OPHTHALMIC | Status: AC | PRN

## 2014-01-04 NOTE — Telephone Encounter (Signed)
Hospice nurse calling stating that pt has been "unaroused" since noon today, she is requesting atropine drops for the secretions in the back of her throat. Dr. Alphonsus Sias did a home visit on Tuesday 01/02/14. Please advise

## 2014-01-04 NOTE — Telephone Encounter (Signed)
Ok to do - sent in to [facility].

## 2014-01-04 NOTE — Telephone Encounter (Signed)
Message left notifying Cindy. 

## 2014-01-06 NOTE — Telephone Encounter (Signed)
I prefer no atropine but hospice is so insistent on it--- oh well. I will discuss this with their clinical staff

## 2014-02-06 DEATH — deceased

## 2015-07-30 IMAGING — CT CT MAXILLOFACIAL W/ CM
3 of 4 series · 14 of 47 positions shown, 16 images · IV contrast (omnipaque)
Comparison: None.

CLINICAL DATA: Left eye pain.  History of multiple sclerosis.

EXAM:
CT MAXILLOFACIAL WITH CONTRAST
TECHNIQUE: Multidetector CT imaging of the maxillofacial structures was
performed with intravenous contrast. Multiplanar CT image
reconstructions were also generated. A small metallic BB was placed
on the right temple in order to reliably differentiate right from
left.
CONTRAST:  100mL OMNIPAQUE IOHEXOL 300 MG/ML  SOLN

[Series 201: facial bones · axial · 0.27mm/px · z∈[+50,+190]mm · 8 of 82 slices shown, 10 images]
[im 6/82  brain]
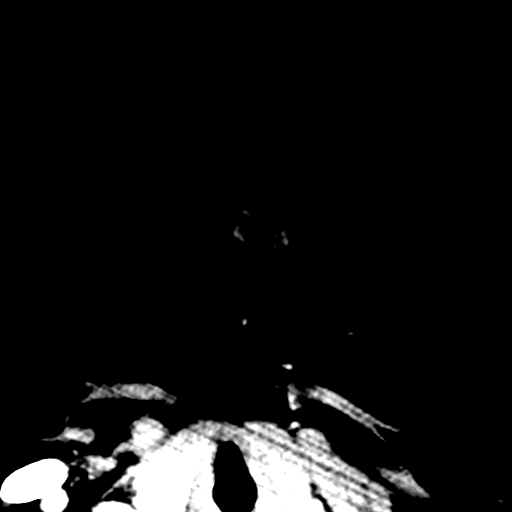
[im 6/82  bone]
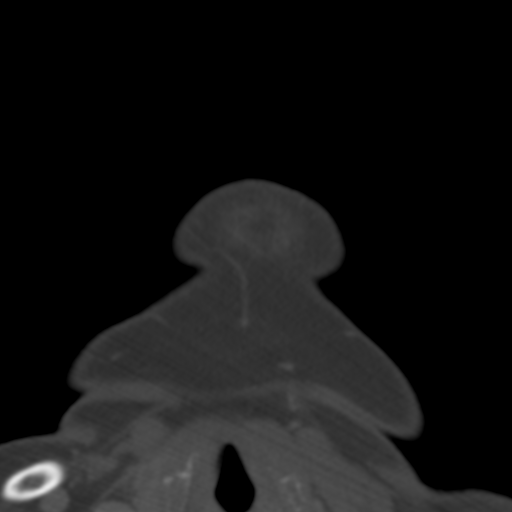
[im 17/82  bone]
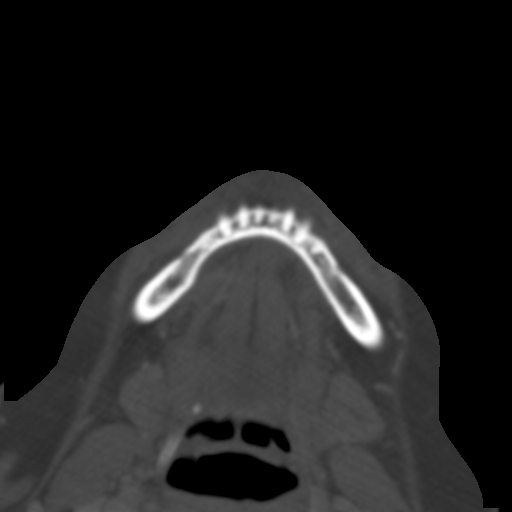
[im 26/82  bone]
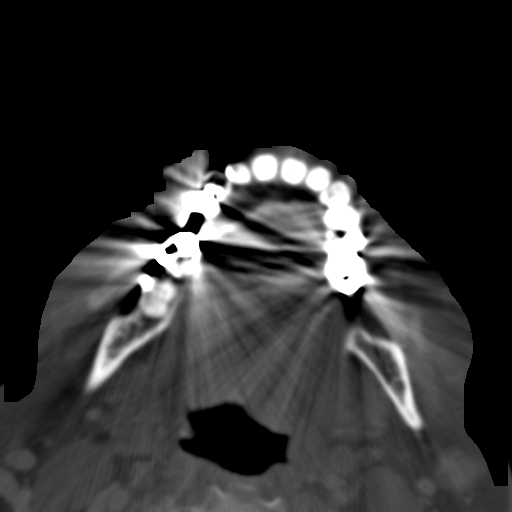
[im 37/82  bone]
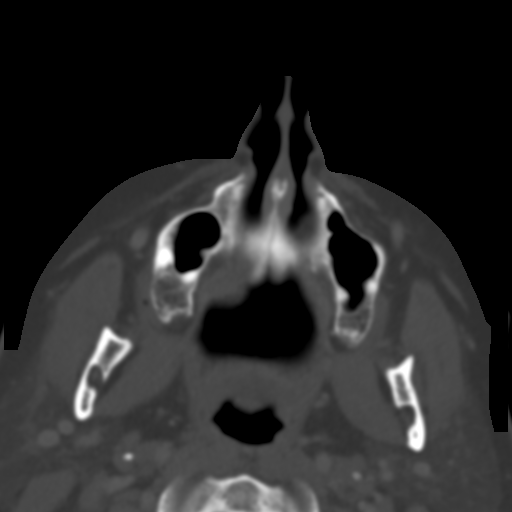
[im 45/82  brain]
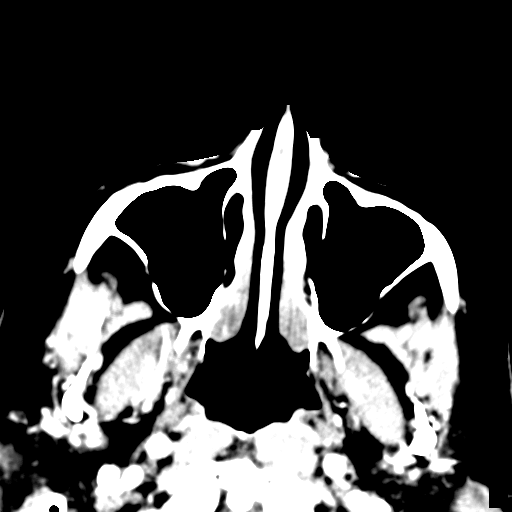
[im 45/82  bone]
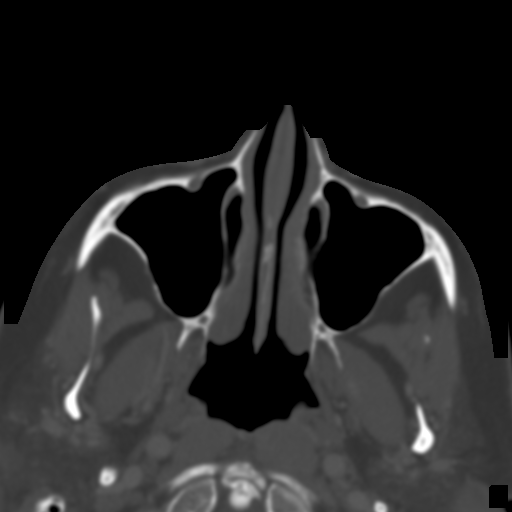
[im 56/82  bone]
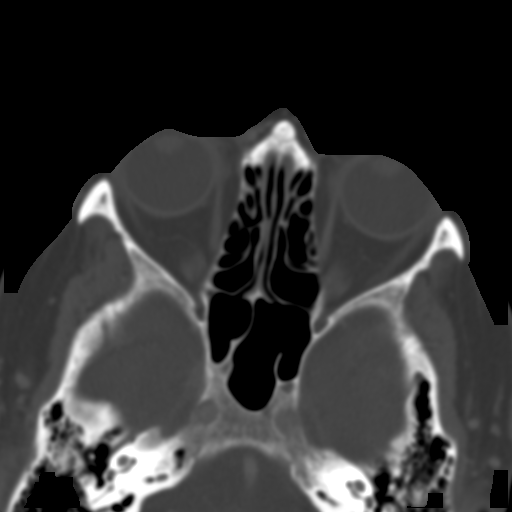
[im 65/82  bone]
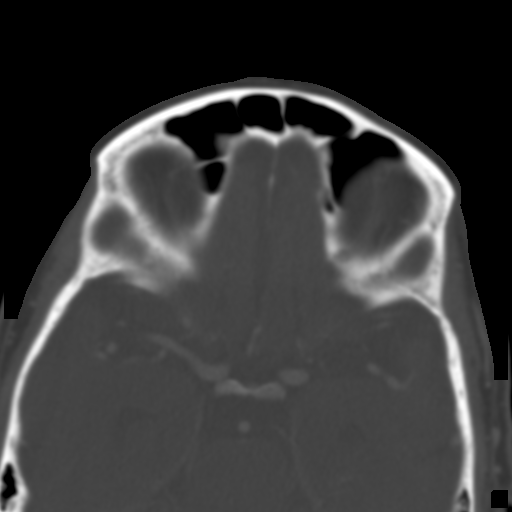
[im 76/82  bone]
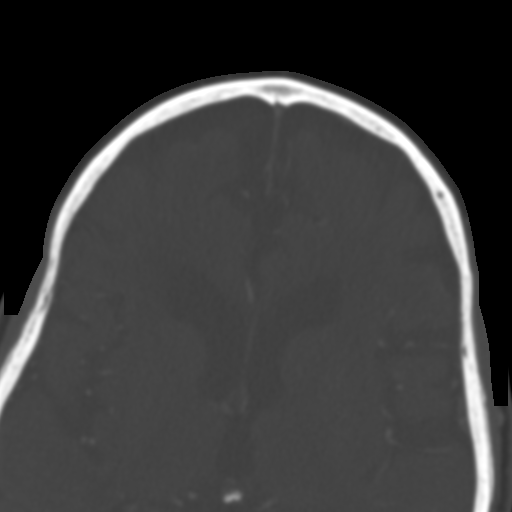

[Series 208: coronals st · coronal · 0.27mm/px · 3 of 379 slices shown]
[im 142/379  bone]
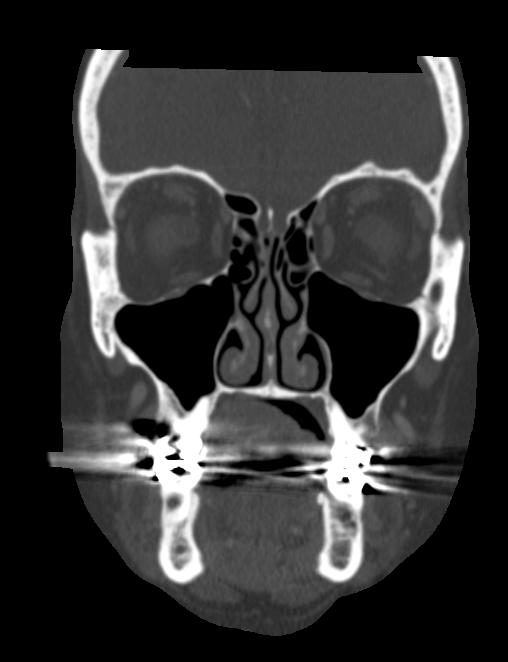
[im 190/379  bone]
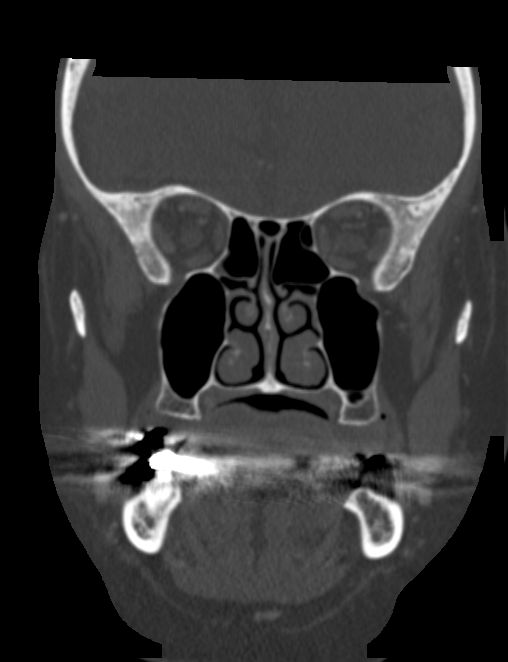
[im 237/379  bone]
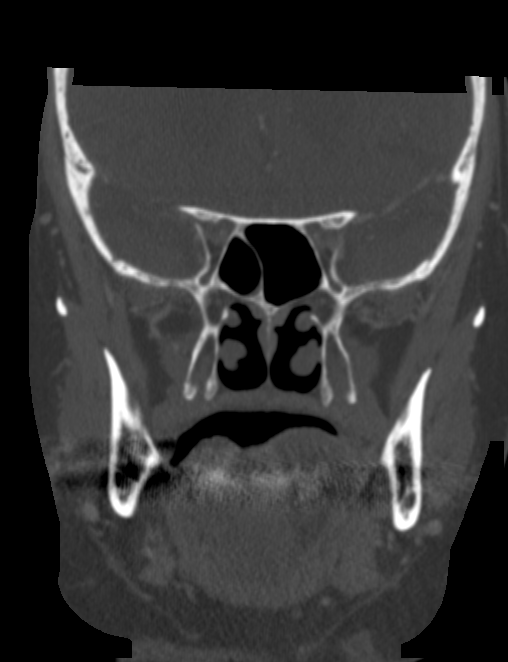

[Series 209: sagittals st · sagittal · 0.27mm/px · 3 of 64 slices shown]
[im 22/64  bone]
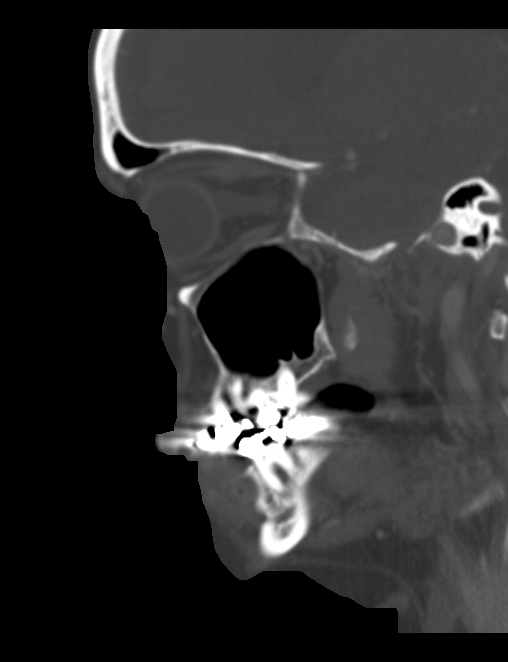
[im 32/64  bone]
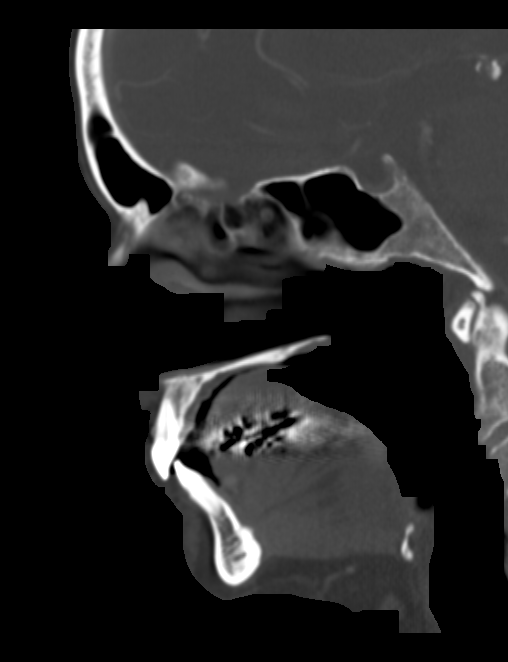
[im 43/64  bone]
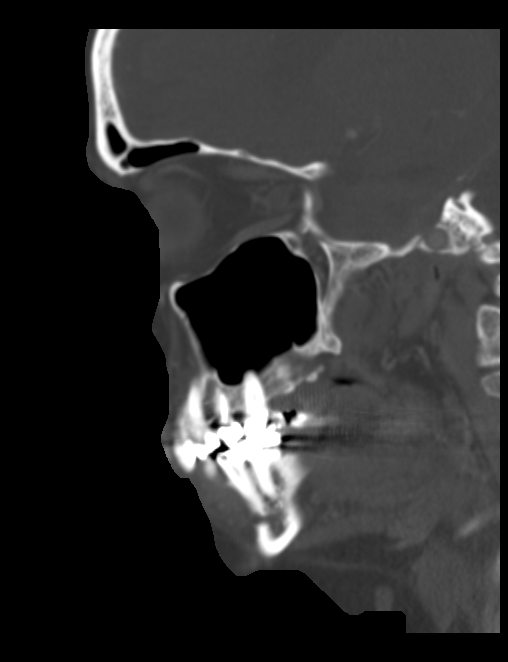

[14 of 47 positions shown; findings below may reference images not displayed]

FINDINGS: The orbits are unremarkable. There is no evidence for abscess,
cellulitis, or mass. The extraocular muscles are symmetric and
unremarkable. Normal appearance of the globes. No facial fracture.

The tongue appears deviated towards the left, but evaluation of the
intrinsic muscles for atrophy or edema is limited due to extensive
streak artifact from dental amalgam. This will be re-evaluated on
neck CT.

Limited intracranial imaging notable for prominent brain atrophy
with ex vacuo ventricular enlargement.

There is a lobulated, enhancing mass within the tail of the parotid
measuring up to 18 mm. The enhancement is mildly heterogeneous, and
shape is irregular. The small area of skin thickening along the left
cheek.
IMPRESSION: 1. No orbital abscess or other acute finding.
2. 18 mm mass in the left parotid tail which could represent
adenopathy or primary parotid neoplasm. Recommend detailed skin exam
in the left scalp and ear. Nonemergent neck CT with contrast
recommended.
